# Patient Record
Sex: Male | Born: 1945 | Race: White | Hispanic: No | Smoking: Former smoker
Health system: Southern US, Community
[De-identification: ages and names within clinical notes are randomized; demographics above are authoritative.]

## PROBLEM LIST (undated history)

## (undated) DIAGNOSIS — G709 Myoneural disorder, unspecified: Secondary | ICD-10-CM

## (undated) DIAGNOSIS — IMO0001 Reserved for inherently not codable concepts without codable children: Secondary | ICD-10-CM

## (undated) DIAGNOSIS — I10 Essential (primary) hypertension: Secondary | ICD-10-CM

## (undated) DIAGNOSIS — C801 Malignant (primary) neoplasm, unspecified: Secondary | ICD-10-CM

## (undated) DIAGNOSIS — F419 Anxiety disorder, unspecified: Secondary | ICD-10-CM

## (undated) DIAGNOSIS — R413 Other amnesia: Secondary | ICD-10-CM

## (undated) DIAGNOSIS — I639 Cerebral infarction, unspecified: Secondary | ICD-10-CM

## (undated) HISTORY — PX: HAND SURGERY: SHX662

## (undated) HISTORY — DX: Other amnesia: R41.3

---

## 2008-08-20 ENCOUNTER — Ambulatory Visit: Payer: Self-pay | Admitting: Diagnostic Radiology

## 2008-08-20 ENCOUNTER — Emergency Department (HOSPITAL_BASED_OUTPATIENT_CLINIC_OR_DEPARTMENT_OTHER): Admission: EM | Admit: 2008-08-20 | Discharge: 2008-08-20 | Payer: Self-pay | Admitting: Emergency Medicine

## 2009-07-28 DIAGNOSIS — I639 Cerebral infarction, unspecified: Secondary | ICD-10-CM

## 2009-07-28 HISTORY — DX: Cerebral infarction, unspecified: I63.9

## 2010-04-22 ENCOUNTER — Ambulatory Visit: Payer: Self-pay | Admitting: Cardiology

## 2010-04-22 ENCOUNTER — Inpatient Hospital Stay (HOSPITAL_COMMUNITY): Admission: EM | Admit: 2010-04-22 | Discharge: 2010-05-02 | Payer: Self-pay | Admitting: Emergency Medicine

## 2010-04-22 ENCOUNTER — Ambulatory Visit: Payer: Self-pay | Admitting: Family Medicine

## 2010-04-25 ENCOUNTER — Encounter: Payer: Self-pay | Admitting: Family Medicine

## 2010-04-26 ENCOUNTER — Ambulatory Visit: Payer: Self-pay | Admitting: Vascular Surgery

## 2010-04-26 ENCOUNTER — Encounter: Payer: Self-pay | Admitting: Family Medicine

## 2010-10-10 LAB — LIPID PANEL
HDL: 56 mg/dL (ref >39)
Total CHOL/HDL Ratio: 4.6 RATIO
VLDL: 27 mg/dL (ref 0–40)

## 2010-10-10 LAB — PTH, INTACT AND CALCIUM: Calcium, Total (PTH): 9.4 mg/dL (ref 8.4–10.5)

## 2010-10-10 LAB — HEMOGLOBIN A1C: Mean Plasma Glucose: 126 mg/dL — ABNORMAL HIGH (ref <117)

## 2010-10-10 LAB — URINALYSIS, ROUTINE W REFLEX MICROSCOPIC
Bilirubin Urine: NEGATIVE
Glucose, UA: NEGATIVE mg/dL
Hgb urine dipstick: NEGATIVE
Specific Gravity, Urine: 1.007 (ref 1.005–1.030)
Urobilinogen, UA: 1 mg/dL (ref 0.0–1.0)
pH: 7.5 (ref 5.0–8.0)

## 2010-10-10 LAB — URINE MICROSCOPIC-ADD ON

## 2010-10-10 LAB — COMPREHENSIVE METABOLIC PANEL
ALT: 29 U/L (ref 0–53)
ALT: 31 U/L (ref 0–53)
ALT: 21 U/L (ref 0–53)
AST: 32 U/L (ref 0–37)
AST: 37 U/L (ref 0–37)
Albumin: 2.5 g/dL — ABNORMAL LOW (ref 3.5–5.2)
Albumin: 2.8 g/dL — ABNORMAL LOW (ref 3.5–5.2)
Alkaline Phosphatase: 71 U/L (ref 39–117)
Alkaline Phosphatase: 76 U/L (ref 39–117)
Alkaline Phosphatase: 97 U/L (ref 39–117)
BUN: 5 mg/dL — ABNORMAL LOW (ref 6–23)
BUN: 6 mg/dL (ref 6–23)
CO2: 29 mEq/L (ref 19–32)
CO2: 29 mEq/L (ref 19–32)
CO2: 37 mEq/L — ABNORMAL HIGH (ref 19–32)
Chloride: 97 mEq/L (ref 96–112)
Creatinine, Ser: 1.27 mg/dL (ref 0.4–1.5)
Creatinine, Ser: 0.72 mg/dL (ref 0.4–1.5)
GFR calc Af Amer: >60 mL/min (ref >60)
GFR calc Af Amer: >60 mL/min (ref >60)
GFR calc non Af Amer: 57 mL/min — ABNORMAL LOW (ref >60)
GFR calc non Af Amer: >60 mL/min (ref >60)
GFR calc non Af Amer: >60 mL/min (ref >60)
Glucose, Bld: 129 mg/dL — ABNORMAL HIGH (ref 70–99)
Glucose, Bld: 104 mg/dL — ABNORMAL HIGH (ref 70–99)
Potassium: 4.3 mEq/L (ref 3.5–5.1)
Potassium: 4.6 mEq/L (ref 3.5–5.1)
Potassium: 2.3 mEq/L — CL (ref 3.5–5.1)
Sodium: 135 mEq/L (ref 135–145)
Sodium: 127 mEq/L — ABNORMAL LOW (ref 135–145)
Total Bilirubin: 0.5 mg/dL (ref 0.3–1.2)
Total Protein: 5.9 g/dL — ABNORMAL LOW (ref 6.0–8.3)
Total Protein: 5.8 g/dL — ABNORMAL LOW (ref 6.0–8.3)

## 2010-10-10 LAB — BASIC METABOLIC PANEL
BUN: 3 mg/dL — ABNORMAL LOW (ref 6–23)
BUN: 5 mg/dL — ABNORMAL LOW (ref 6–23)
CO2: 29 mEq/L (ref 19–32)
CO2: 31 mEq/L (ref 19–32)
CO2: 34 mEq/L — ABNORMAL HIGH (ref 19–32)
Calcium: 7.6 mg/dL — ABNORMAL LOW (ref 8.4–10.5)
Calcium: 8.2 mg/dL — ABNORMAL LOW (ref 8.4–10.5)
Calcium: 8.6 mg/dL (ref 8.4–10.5)
Chloride: 91 mEq/L — ABNORMAL LOW (ref 96–112)
Chloride: 92 mEq/L — ABNORMAL LOW (ref 96–112)
Creatinine, Ser: 0.66 mg/dL (ref 0.4–1.5)
Creatinine, Ser: 0.75 mg/dL (ref 0.4–1.5)
Creatinine, Ser: 0.75 mg/dL (ref 0.4–1.5)
GFR calc Af Amer: >60 mL/min (ref >60)
GFR calc Af Amer: >60 mL/min (ref >60)
GFR calc Af Amer: >60 mL/min (ref >60)
GFR calc Af Amer: >60 mL/min (ref >60)
GFR calc non Af Amer: >60 mL/min (ref >60)
GFR calc non Af Amer: >60 mL/min (ref >60)
GFR calc non Af Amer: >60 mL/min (ref >60)
GFR calc non Af Amer: >60 mL/min (ref >60)
Glucose, Bld: 104 mg/dL — ABNORMAL HIGH (ref 70–99)
Glucose, Bld: 117 mg/dL — ABNORMAL HIGH (ref 70–99)
Glucose, Bld: 141 mg/dL — ABNORMAL HIGH (ref 70–99)
Potassium: 4.3 mEq/L (ref 3.5–5.1)
Potassium: 4.6 mEq/L (ref 3.5–5.1)
Potassium: 2.4 mEq/L — CL (ref 3.5–5.1)
Sodium: 128 mEq/L — ABNORMAL LOW (ref 135–145)
Sodium: 132 mEq/L — ABNORMAL LOW (ref 135–145)
Sodium: 131 mEq/L — ABNORMAL LOW (ref 135–145)
Sodium: 132 mEq/L — ABNORMAL LOW (ref 135–145)

## 2010-10-10 LAB — CBC
HCT: 38.7 % — ABNORMAL LOW (ref 39.0–52.0)
HCT: 39.5 % (ref 39.0–52.0)
HCT: 44.1 % (ref 39.0–52.0)
Hemoglobin: 13.6 g/dL (ref 13.0–17.0)
Hemoglobin: 14.6 g/dL (ref 13.0–17.0)
Hemoglobin: 16.1 g/dL (ref 13.0–17.0)
MCH: 31.6 pg (ref 26.0–34.0)
MCH: 32.4 pg (ref 26.0–34.0)
MCHC: 37.2 g/dL — ABNORMAL HIGH (ref 30.0–36.0)
MCHC: 37.5 g/dL — ABNORMAL HIGH (ref 30.0–36.0)
MCV: 86.4 fL (ref 78.0–100.0)
MCV: 88.1 fL (ref 78.0–100.0)
Platelets: 266 10*3/uL (ref 150–400)
Platelets: 298 10*3/uL (ref 150–400)
Platelets: 302 10*3/uL (ref 150–400)
RBC: 4.62 MIL/uL (ref 4.22–5.81)
RBC: 5.07 MIL/uL (ref 4.22–5.81)
RDW: 13 % (ref 11.5–15.5)
RDW: 13.3 % (ref 11.5–15.5)
RDW: 13.3 % (ref 11.5–15.5)
RDW: 13.4 % (ref 11.5–15.5)
WBC: 10.3 10*3/uL (ref 4.0–10.5)
WBC: 7.6 10*3/uL (ref 4.0–10.5)
WBC: 8.7 10*3/uL (ref 4.0–10.5)
WBC: 8.8 10*3/uL (ref 4.0–10.5)
WBC: 9.6 10*3/uL (ref 4.0–10.5)

## 2010-10-10 LAB — GLUCOSE, CAPILLARY: Glucose-Capillary: 122 mg/dL — ABNORMAL HIGH (ref 70–99)

## 2010-10-10 LAB — CARDIAC PANEL(CRET KIN+CKTOT+MB+TROPI)
CK, MB: 2.1 ng/mL (ref 0.3–4.0)
CK, MB: 2.2 ng/mL (ref 0.3–4.0)
Relative Index: 2 (ref 0.0–2.5)
Relative Index: INVALID (ref 0.0–2.5)
Total CK: 109 U/L (ref 7–232)
Troponin I: 0.04 ng/mL (ref 0.00–0.06)

## 2010-10-10 LAB — BASIC METABOLIC PANEL WITH GFR
BUN: 24 mg/dL — ABNORMAL HIGH (ref 6–23)
Calcium: 9.1 mg/dL (ref 8.4–10.5)
Creatinine, Ser: 1.21 mg/dL (ref 0.4–1.5)
GFR calc non Af Amer: >60 mL/min (ref >60)
Glucose, Bld: 89 mg/dL (ref 70–99)

## 2010-10-10 LAB — DIFFERENTIAL
Blasts: 0 %
Lymphocytes Relative: 19 % (ref 12–46)
Lymphs Abs: 1.7 10*3/uL (ref 0.7–4.0)
Myelocytes: 0 %
Neutro Abs: 6.1 10*3/uL (ref 1.7–7.7)
Neutrophils Relative %: 70 % (ref 43–77)
Promyelocytes Absolute: 0 %
nRBC: 0 /100 WBC

## 2010-10-10 LAB — AMMONIA: Ammonia: 16 umol/L (ref 11–35)

## 2010-10-10 LAB — MAGNESIUM
Magnesium: 2.3 mg/dL (ref 1.5–2.5)
Magnesium: 2.3 mg/dL (ref 1.5–2.5)
Magnesium: 2.4 mg/dL (ref 1.5–2.5)

## 2010-10-10 LAB — PREALBUMIN: Prealbumin: 25.4 mg/dL (ref 18.0–45.0)

## 2010-10-10 LAB — ETHANOL: Alcohol, Ethyl (B): <5 mg/dL (ref 0–10)

## 2010-10-10 LAB — POTASSIUM
Potassium: 3.5 mEq/L (ref 3.5–5.1)
Potassium: 3.5 mEq/L (ref 3.5–5.1)

## 2010-10-10 LAB — PHOSPHORUS: Phosphorus: 6.2 mg/dL — ABNORMAL HIGH (ref 2.3–4.6)

## 2010-10-10 LAB — PROTIME-INR: INR: 0.89 (ref 0.00–1.49)

## 2010-10-10 LAB — PSA: PSA: 0.8 ng/mL (ref 0.10–4.00)

## 2010-10-10 LAB — HEMOCCULT GUIAC POC 1CARD (OFFICE): Fecal Occult Bld: NEGATIVE

## 2010-11-11 LAB — CBC
HCT: 38.4 % — ABNORMAL LOW (ref 39.0–52.0)
Hemoglobin: 13.5 g/dL (ref 13.0–17.0)
RBC: 4.15 MIL/uL — ABNORMAL LOW (ref 4.22–5.81)
RDW: 12.1 % (ref 11.5–15.5)

## 2010-11-11 LAB — BASIC METABOLIC PANEL
BUN: 5 mg/dL — ABNORMAL LOW (ref 6–23)
CO2: 28 mEq/L (ref 19–32)
Calcium: 8.1 mg/dL — ABNORMAL LOW (ref 8.4–10.5)
Chloride: 87 mEq/L — ABNORMAL LOW (ref 96–112)
Creatinine, Ser: 0.7 mg/dL (ref 0.4–1.5)
GFR calc Af Amer: >60 mL/min (ref >60)
GFR calc Af Amer: >60 mL/min (ref >60)
Potassium: 3.8 mEq/L (ref 3.5–5.1)

## 2010-11-11 LAB — POCT CARDIAC MARKERS: Troponin i, poc: <0.05 ng/mL (ref 0.00–0.09)

## 2010-11-11 LAB — DIFFERENTIAL
Eosinophils Relative: 2 % (ref 0–5)
Lymphocytes Relative: 13 % (ref 12–46)
Lymphs Abs: 1 10*3/uL (ref 0.7–4.0)
Monocytes Absolute: 0.6 10*3/uL (ref 0.1–1.0)

## 2014-08-16 ENCOUNTER — Emergency Department (HOSPITAL_COMMUNITY)
Admission: EM | Admit: 2014-08-16 | Discharge: 2014-08-16 | Disposition: A | Discharge status: Home / Self Care | Payer: Medicare Other | Attending: Emergency Medicine | Admitting: Emergency Medicine

## 2014-08-16 ENCOUNTER — Emergency Department (HOSPITAL_COMMUNITY): Payer: Medicare Other

## 2014-08-16 ENCOUNTER — Encounter (HOSPITAL_COMMUNITY): Payer: Self-pay

## 2014-08-16 DIAGNOSIS — R55 Syncope and collapse: Secondary | ICD-10-CM | POA: Diagnosis not present

## 2014-08-16 DIAGNOSIS — Z7982 Long term (current) use of aspirin: Secondary | ICD-10-CM | POA: Insufficient documentation

## 2014-08-16 DIAGNOSIS — I10 Essential (primary) hypertension: Secondary | ICD-10-CM | POA: Diagnosis not present

## 2014-08-16 DIAGNOSIS — Z87891 Personal history of nicotine dependence: Secondary | ICD-10-CM | POA: Diagnosis not present

## 2014-08-16 DIAGNOSIS — Z8673 Personal history of transient ischemic attack (TIA), and cerebral infarction without residual deficits: Secondary | ICD-10-CM | POA: Diagnosis not present

## 2014-08-16 DIAGNOSIS — E871 Hypo-osmolality and hyponatremia: Secondary | ICD-10-CM | POA: Diagnosis not present

## 2014-08-16 DIAGNOSIS — Z79899 Other long term (current) drug therapy: Secondary | ICD-10-CM | POA: Insufficient documentation

## 2014-08-16 DIAGNOSIS — R4182 Altered mental status, unspecified: Secondary | ICD-10-CM

## 2014-08-16 HISTORY — DX: Cerebral infarction, unspecified: I63.9

## 2014-08-16 HISTORY — DX: Essential (primary) hypertension: I10

## 2014-08-16 LAB — I-STAT CHEM 8, ED
BUN: 9 mg/dL (ref 6–23)
CALCIUM ION: 1.06 mmol/L — AB (ref 1.13–1.30)
Chloride: 93 mEq/L — ABNORMAL LOW (ref 96–112)
Creatinine, Ser: 1.2 mg/dL (ref 0.50–1.35)
Glucose, Bld: 114 mg/dL — ABNORMAL HIGH (ref 70–99)
HCT: 45 % (ref 39.0–52.0)
HEMOGLOBIN: 15.3 g/dL (ref 13.0–17.0)
Potassium: 4.1 mmol/L (ref 3.5–5.1)
SODIUM: 127 mmol/L — AB (ref 135–145)
TCO2: 19 mmol/L (ref 0–100)

## 2014-08-16 LAB — CBC
HCT: 37.9 % — ABNORMAL LOW (ref 39.0–52.0)
Hemoglobin: 13.7 g/dL (ref 13.0–17.0)
MCH: 30.8 pg (ref 26.0–34.0)
MCHC: 36.1 g/dL — ABNORMAL HIGH (ref 30.0–36.0)
MCV: 85.2 fL (ref 78.0–100.0)
Platelets: 300 10*3/uL (ref 150–400)
RBC: 4.45 MIL/uL (ref 4.22–5.81)
RDW: 12.7 % (ref 11.5–15.5)
WBC: 11.5 10*3/uL — ABNORMAL HIGH (ref 4.0–10.5)

## 2014-08-16 LAB — COMPREHENSIVE METABOLIC PANEL
ALT: 14 U/L (ref 0–53)
AST: 23 U/L (ref 0–37)
Albumin: 3.9 g/dL (ref 3.5–5.2)
Alkaline Phosphatase: 64 U/L (ref 39–117)
Anion gap: 11 (ref 5–15)
BUN: 8 mg/dL (ref 6–23)
CALCIUM: 9.2 mg/dL (ref 8.4–10.5)
CO2: 23 mmol/L (ref 19–32)
Chloride: 93 mEq/L — ABNORMAL LOW (ref 96–112)
Creatinine, Ser: 1.27 mg/dL (ref 0.50–1.35)
GFR calc non Af Amer: 56 mL/min — ABNORMAL LOW (ref >90)
GFR, EST AFRICAN AMERICAN: 65 mL/min — AB (ref >90)
GLUCOSE: 115 mg/dL — AB (ref 70–99)
Potassium: 4.1 mmol/L (ref 3.5–5.1)
SODIUM: 127 mmol/L — AB (ref 135–145)
TOTAL PROTEIN: 6.7 g/dL (ref 6.0–8.3)
Total Bilirubin: 0.6 mg/dL (ref 0.3–1.2)

## 2014-08-16 LAB — DIFFERENTIAL
BASOS ABS: 0 10*3/uL (ref 0.0–0.1)
Basophils Relative: 0 % (ref 0–1)
EOS ABS: 0.3 10*3/uL (ref 0.0–0.7)
Eosinophils Relative: 2 % (ref 0–5)
Lymphocytes Relative: 7 % — ABNORMAL LOW (ref 12–46)
Lymphs Abs: 0.8 10*3/uL (ref 0.7–4.0)
Monocytes Absolute: 0.8 10*3/uL (ref 0.1–1.0)
Monocytes Relative: 7 % (ref 3–12)
Neutro Abs: 9.6 10*3/uL — ABNORMAL HIGH (ref 1.7–7.7)
Neutrophils Relative %: 84 % — ABNORMAL HIGH (ref 43–77)

## 2014-08-16 LAB — APTT: aPTT: 27 seconds (ref 24–37)

## 2014-08-16 LAB — ETHANOL: Alcohol, Ethyl (B): <5 mg/dL (ref 0–9)

## 2014-08-16 LAB — I-STAT TROPONIN, ED: Troponin i, poc: 0 ng/mL (ref 0.00–0.08)

## 2014-08-16 LAB — PROTIME-INR
INR: 1.06 (ref 0.00–1.49)
PROTHROMBIN TIME: 13.9 s (ref 11.6–15.2)

## 2014-08-16 MED ORDER — BACLOFEN 10 MG PO TABS: 10.0000 mg | ORAL_TABLET | Freq: Three times a day (TID) | ORAL | Status: DC

## 2014-08-16 NOTE — ED Notes (Signed)
[facility]- pt having lunch with daughter and had a spell of confusion and unresponsiveness. Symptoms resolved by EMS arrival. A&O x4 at this time. Pt has hx of stroke with right side paralysis, also has hx of TIA. Pt at baseline on arrival.

## 2014-08-16 NOTE — Discharge Instructions (Signed)
You MUST see the doctors in the outpatient setting. - see the above phone numbers  REturn to the ER if you develop recurrent, prolonged or worsening symptoms.  Please call your doctor for a followup appointment within 24-48 hours. When you talk to your doctor please let them know that you were seen in the emergency department and have them acquire all of your records so that they can discuss the findings with you and formulate a treatment plan to fully care for your new and ongoing problems.

## 2014-08-16 NOTE — ED Provider Notes (Signed)
CSN: [id number]     Arrival date & time 08/16/14  1314 History   First MD Initiated Contact with Patient 08/16/14 1321     Chief Complaint  Patient presents with  . Transient Ischemic Attack     (Consider location/radiation/quality/duration/timing/severity/associated sxs/prior Treatment) HPI Comments: The patient is a 69 year old male with a history of stroke affecting his right side including his right upper and lower extremities. His family members report that they were at lunch with him eating a biscuit when he had acute onset of altered mental status, he was very confused and then went unresponsive with his head drooped, drooling out of one side of his mouth, there was no seizure activity, no vomiting, this lasted several minutes and then resolve spontaneously. By the time the paramedics arrived the patient had no symptoms. He does have a history of stroke with right-sided paralysis, he has also had TIAs in the past, they usually present like this. He does take a baby aspirin as anticoagulation. At this time the patient and his family members feel like he is at his baseline.  The history is provided by the patient.    Past Medical History  Diagnosis Date  . Stroke   . Hypertension    Past Surgical History  Procedure Laterality Date  . Hand surgery     History reviewed. No pertinent family history. History  Substance Use Topics  . Smoking status: Former Smoker    Quit date: 04/26/2010  . Smokeless tobacco: Not on file  . Alcohol Use: 12.0 oz/week    20 Cans of beer per week     Comment: Pt drinks daily    Review of Systems  All other systems reviewed and are negative.     Allergies  Review of patient's allergies indicates no known allergies.  Home Medications   Prior to Admission medications   Medication Sig Start Date End Date Taking? Authorizing Provider  amLODipine (NORVASC) 5 MG tablet Take 5 mg by mouth every morning. 06/30/14  Yes Historical Provider, MD   aspirin EC 81 MG tablet Take 81 mg by mouth daily.   Yes Historical Provider, MD  Cholecalciferol (VITAMIN D3 PO) Take 1 tablet by mouth daily.   Yes Historical Provider, MD  FOLIC ACID PO Take 1 tablet by mouth daily.   Yes Historical Provider, MD  lisinopril (PRINIVIL,ZESTRIL) 40 MG tablet Take 40 mg by mouth every morning. 06/30/14  Yes Historical Provider, MD  Thiamine HCl (VITAMIN B-1 PO) Take 1 tablet by mouth daily.   Yes Historical Provider, MD   BP 149/78 mmHg  Pulse 92  Temp(Src) 97.8 F (36.6 C) (Oral)  Resp 12  SpO2 99% Physical Exam  Constitutional: He appears well-developed and well-nourished. No distress.  HENT:  Head: Normocephalic and atraumatic.  Mouth/Throat: Oropharynx is clear and moist. No oropharyngeal exudate.  Eyes: Conjunctivae and EOM are normal. Pupils are equal, round, and reactive to light. Right eye exhibits no discharge. Left eye exhibits no discharge. No scleral icterus.  Neck: Normal range of motion. Neck supple. No JVD present. No thyromegaly present.  Cardiovascular: Normal rate, regular rhythm, normal heart sounds and intact distal pulses.  Exam reveals no gallop and no friction rub.   No murmur heard. Pulmonary/Chest: Effort normal and breath sounds normal. No respiratory distress. He has no wheezes. He has no rales.  Abdominal: Soft. Bowel sounds are normal. He exhibits no distension and no mass. There is no tenderness.  Musculoskeletal: Normal range of motion. He exhibits  no edema or tenderness.  Lymphadenopathy:    He has no cervical adenopathy.  Neurological: He is alert. Coordination normal.  Neurologic exam is baseline, the patient has flexion contractures of the right upper extremity, he is able to lift the right lower extremity off the bed though not as strong as the left side. He has no facial droop, cranial nerves III through XII are intact, no slurred speech.  Skin: Skin is warm and dry. No rash noted. No erythema.  Psychiatric: He has a  normal mood and affect. His behavior is normal.  Nursing note and vitals reviewed.   ED Course  Procedures (including critical care time) Labs Review Labs Reviewed  CBC - Abnormal; Notable for the following:    WBC 11.5 (*)    HCT 37.9 (*)    MCHC 36.1 (*)    All other components within normal limits  DIFFERENTIAL - Abnormal; Notable for the following:    Neutrophils Relative % 84 (*)    Neutro Abs 9.6 (*)    Lymphocytes Relative 7 (*)    All other components within normal limits  COMPREHENSIVE METABOLIC PANEL - Abnormal; Notable for the following:    Sodium 127 (*)    Chloride 93 (*)    Glucose, Bld 115 (*)    GFR calc non Af Amer 56 (*)    GFR calc Af Amer 65 (*)    All other components within normal limits  I-STAT CHEM 8, ED - Abnormal; Notable for the following:    Sodium 127 (*)    Chloride 93 (*)    Glucose, Bld 114 (*)    Calcium, Ion 1.06 (*)    All other components within normal limits  PROTIME-INR  APTT  ETHANOL  I-STAT TROPOININ, ED  I-STAT TROPOININ, ED    Imaging Review Ct Head Wo Contrast  08/16/2014   CLINICAL DATA:  Recent syncopal episode  EXAM: CT HEAD WITHOUT CONTRAST  TECHNIQUE: Contiguous axial images were obtained from the base of the skull through the vertex without intravenous contrast.  COMPARISON:  04/24/2010  FINDINGS: Bony calvarium is intact. Diffuse atrophic changes and chronic white matter ischemic change is noted. Changes are similar to that seen on the prior exam. No findings to suggest acute hemorrhage, acute infarction or space-occupying mass lesion are noted. Bilateral thalamic lacunar infarcts are seen, new from the prior exam.  IMPRESSION: Chronic atrophic in ischemic change.  No acute abnormality is noted.   Electronically Signed   By: Inez Catalina M.D.   On: 08/16/2014 15:13      MDM   Final diagnoses:  Altered mental status, unspecified altered mental status type  Hyponatremia    The patient is well-appearing at this time,  he appears at baseline, family agrees, he did have some kind of neurologic event which could be consistent with a stroke, would also consider seizure, syncope, observation in the emergency department with labs, readdress. I have suggested that the patient be admitted to the hospital, he does not want to be admitted at this time.  ED ECG REPORT  I personally interpreted this EKG   Date: 08/16/2014   Rate: 78  Rhythm: normal sinus rhythm  QRS Axis: normal  Intervals: normal  ST/T Wave abnormalities: normal  Conduction Disutrbances:none  Narrative Interpretation:   Old EKG Reviewed: none available  D/w Neuro - Dr. Doy Mince agrees that pt can be f/u in outpt setting, recommends against admission for TIA w/u - the patient refuses admission anyway, he  will follow-up in the outpatient setting with neurology.    No recurrent abnormal MS - has been at baseline since arrival.  Pt aware of need to f/u and is agreeable.  Filed Vitals:   08/16/14 1321 08/16/14 1325 08/16/14 1433  BP:  149/78   Pulse:  92   Temp:  97.8 F (36.6 C) 97.8 F (36.6 C)  TempSrc:  Oral   Resp:  12   SpO2: 97% 99%      Johnna Acosta, MD 08/16/14 [phone]

## 2014-08-16 NOTE — ED Notes (Addendum)
Pt family has questions concerning discharge, Dr. Sabra Heck at bedside to discuss concerns.

## 2014-09-21 ENCOUNTER — Encounter: Payer: Self-pay | Admitting: *Deleted

## 2014-09-27 ENCOUNTER — Encounter: Payer: Self-pay | Admitting: Neurology

## 2014-09-27 ENCOUNTER — Ambulatory Visit (INDEPENDENT_AMBULATORY_CARE_PROVIDER_SITE_OTHER): Payer: Medicare Other | Admitting: Neurology

## 2014-09-27 VITALS — BP 130/82 | HR 80 | Temp 98.6°F | Resp 20 | Ht 69.0 in | Wt 125.6 lb

## 2014-09-27 DIAGNOSIS — I69959 Hemiplegia and hemiparesis following unspecified cerebrovascular disease affecting unspecified side: Secondary | ICD-10-CM

## 2014-09-27 DIAGNOSIS — R4182 Altered mental status, unspecified: Secondary | ICD-10-CM

## 2014-09-27 DIAGNOSIS — I69359 Hemiplegia and hemiparesis following cerebral infarction affecting unspecified side: Secondary | ICD-10-CM

## 2014-09-27 MED ORDER — BACLOFEN 10 MG PO TABS
10.0000 mg | ORAL_TABLET | Freq: Four times a day (QID) | ORAL | Status: AC
Start: 2014-09-27 — End: ?

## 2014-09-27 MED ORDER — PRAVASTATIN SODIUM 80 MG PO TABS: 80.0000 mg | ORAL_TABLET | Freq: Every day | ORAL | Status: DC

## 2014-09-27 NOTE — Progress Notes (Signed)
NEUROLOGY CONSULTATION NOTE  Hector Rice MRN: [id number] DOB: [birth year]  Referring provider: ED Primary care provider: none  Reason for consult:  TIA  HISTORY OF PRESENT ILLNESS: Hector Rice is a 69 year old right-handed man with hypertension and history of stroke and TIAs who presents via ED referral for transient ischemic attack.  Records, labs, MRI and MRA of head, and CT of head reviewed.  On 04/22/10, he had an acute left thalamo-capsular infarct, presenting with right sided weakness.  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  He was on Plavix and it was advised that he continue it.  During that admission, he exhibited brief jerking of the right arm.  MRA of head, echo and carotid dopplers were unremarkable.  EEG was performed, which was unremarkable.  He was started on Keppra 500mg  twice daily.  Since then, he was followed by a neurologist, Dr. Lacinda Axon, in [place].  Due to bruising, Plavix was switched to ASA 81mg .  He subsequently tapered himself off of the Keppra.  He takes baclofen 10mg  4 times daily for spasticity.  He also takes Provachal 80mg  and blood pressure medications.  These were all filled by Dr. Lacinda Axon as he does not have a primary care physician.  He has been out of his medications because he has not followed up with him in a year.  Due to bruising,   On 08/16/14, he was at lunch with family when he suddenly lost consciousness.  They had not begun eating yet.  He momentarily felt flushed before he passed out.  He reportedly was drooling and he was unconscious for about a couple of minutes.  He denied any preceding lightheadedness, tunnel vision or palpitations.  He was back to baseline when EMS arrived.  He was evaluated at the ED.  EKG showed sinus rhythm of 78 bpm.  CT of head showed diffuse atrophy and chronic small vessel ischemic changes including remote bilateral thalamic infarcts, which are new compared to prior study from 04/24/10.  He was found to be  hyponatremic, which level of 127.  Renal function and LFTs were unremarkable.  CBC showed mildly elevated WBC of 11.5 and low HCT of 37.9, but overall unremarkable.  He reports 2 or 3 similar previous episodes of passing out prior to the stroke.  PAST MEDICAL HISTORY: Past Medical History  Diagnosis Date  . Stroke   . Hypertension   . Memory loss     PAST SURGICAL HISTORY: Past Surgical History  Procedure Laterality Date  . Hand surgery      MEDICATIONS: Current Outpatient Prescriptions on File Prior to Visit  Medication Sig Dispense Refill  . amLODipine (NORVASC) 5 MG tablet Take 5 mg by mouth every morning.  3  . aspirin EC 81 MG tablet Take 81 mg by mouth daily.    . Cholecalciferol (VITAMIN D3 PO) Take 1 tablet by mouth daily.    Marland Kitchen FOLIC ACID PO Take 1 tablet by mouth daily.    Marland Kitchen lisinopril (PRINIVIL,ZESTRIL) 40 MG tablet Take 40 mg by mouth every morning.  3  . Thiamine HCl (VITAMIN B-1 PO) Take 1 tablet by mouth daily.     No current facility-administered medications on file prior to visit.    ALLERGIES: No Known Allergies  FAMILY HISTORY: Family History  Problem Relation Age of Onset  . Alzheimer's disease Mother 55  . Heart failure Father 1  . Immunocompromised Sister   . COPD Sister   . Migraines Daughter   .  Immunocompromised Daughter   . Stroke Maternal Grandmother     SOCIAL HISTORY: History   Social History  . Marital Status: Single    Spouse Name: N/A  . Number of Children: N/A  . Years of Education: N/A   Occupational History  . Not on file.   Social History Main Topics  . Smoking status: Former Smoker    Quit date: 04/26/2010  . Smokeless tobacco: Never Used  . Alcohol Use: 12.0 oz/week    20 Cans of beer per week     Comment: Pt drinks daily  . Drug Use: Not on file  . Sexual Activity:    Partners: Female   Other Topics Concern  . Not on file   Social History Narrative    REVIEW OF SYSTEMS: Constitutional: No fevers,  chills, or sweats, no generalized fatigue, change in appetite Eyes: No visual changes, double vision, eye pain Ear, nose and throat: No hearing loss, ear pain, nasal congestion, sore throat Cardiovascular: No chest pain, palpitations Respiratory:  No shortness of breath at rest or with exertion, wheezes GastrointestinaI: No nausea, vomiting, diarrhea, abdominal pain, fecal incontinence Genitourinary:  No dysuria, urinary retention or frequency Musculoskeletal:  No neck pain, back pain Integumentary: No rash, pruritus, skin lesions Neurological: as above Psychiatric: No depression, insomnia, anxiety Endocrine: No palpitations, fatigue, diaphoresis, mood swings, change in appetite, change in weight, increased thirst Hematologic/Lymphatic:  No anemia, purpura, petechiae. Allergic/Immunologic: no itchy/runny eyes, nasal congestion, recent allergic reactions, rashes  PHYSICAL EXAM: Filed Vitals:   09/27/14 0956  BP: 130/82  Pulse: 80  Temp: 98.6 F (37 C)  Resp: 20   General: No acute distress Head:  Normocephalic/atraumatic Eyes:  fundi unremarkable, without vessel changes, exudates, hemorrhages or papilledema. Neck: supple, no paraspinal tenderness, full range of motion Back: No paraspinal tenderness Heart: regular rate and rhythm Lungs: Clear to auscultation bilaterally. Vascular: No carotid bruits. Neurological Exam: Mental status: alert and oriented to person, place, and time, recent and remote memory intact, fund of knowledge intact, attention and concentration intact, speech fluent and not dysarthric, language intact. Cranial nerves: CN I: not tested CN II: pupils equal, round and reactive to light, visual fields intact, fundi unremarkable, without vessel changes, exudates, hemorrhages or papilledema. CN III, IV, VI:  full range of motion, no nystagmus, no ptosis CN V: facial sensation intact CN VII: upper and lower face symmetric CN VIII: hearing intact CN IX, X: gag  intact, uvula midline CN XI: sternocleidomastoid and trapezius muscles intact CN XII: tongue midline Bulk & Tone: spastic right upper greater than lower extremities. Motor:  Right upper extremity paretic.  Right ankle dorsiflexion 3/5.  Otherwise 5/5. Sensation:  Reduced pinprick and vibration sensation in right lower extremity Deep Tendon Reflexes:  3+ right upper and lower extremities, 2+ on left.  Toes withdraw bilaterally Finger to nose testing:  No dysmetria with left arm. Gait:  Unable to bear weight on right leg and walk unassisted.  IMPRESSION: Transient loss of consciousness.  I don't suspect this was a TIA, as symptoms are nonfocal and he has had similar episodes in the past.  Seizure or cardiac etiology should be considered History of stroke with residual spastic right hemiparesis  PLAN: 1.  Continue ASA 81mg  daily 2.  Will refill baclofen 10mg  four times daily 3.  Will refill Provachal 80mg  daily 4.  Will get EEG.  May consider restart Keppra 500mg  twice daily 5.  Will set him up to establish care with a PCP to  manage his blood pressure and cholesterol medication 6.  Follow up in 3 months.  Thank you for allowing me to take part in the care of this patient.  Metta Clines, DO

## 2014-09-27 NOTE — Patient Instructions (Addendum)
1.  I refilled the baclofen 4mg  4 times daily 2.  Continue the aspirin 81mg  daily 3.  We will get an EEG.  I may consider to start you on a seizure medication again (Keppra 500mg  twice daily) 4.  You need to establish care with a primary care physician for refills of other medications 5.  Follow up in 3 months. 6. Your Rx refill for Pravachol has been sent to your pharmacy   Please call [facility] to establish care [phone].

## 2014-10-02 ENCOUNTER — Ambulatory Visit (INDEPENDENT_AMBULATORY_CARE_PROVIDER_SITE_OTHER): Payer: Medicare Other | Admitting: Neurology

## 2014-10-02 DIAGNOSIS — R404 Transient alteration of awareness: Secondary | ICD-10-CM

## 2014-10-04 ENCOUNTER — Telehealth: Payer: Self-pay | Admitting: *Deleted

## 2014-10-04 NOTE — Procedures (Signed)
ELECTROENCEPHALOGRAM REPORT  Date of Study: 10/02/2014  Patient's Name: Hector Rice MRN: [id number] Date of Birth: 1946-01-18  Indication: 69 year old man with history of stroke who presents for recurrent brief episodes of loss of consciousness  Medications: ASA Amlodipine Lisinopril  Technical Summary: This is a multichannel digital EEG recording, using the international 10-20 placement system.  Spike detection software was employed.  Description: The EEG background is symmetric, with a well-developed posterior dominant rhythm of 8-9 Hz, which is reactive to eye opening and closing.  Diffuse beta activity is seen, with a bilateral frontal preponderance.  No focal or generalized abnormalities are seen.  No focal or generalized epileptiform discharges are seen.  Stage II sleep is not seen.  Photic stimulation was performed, and produced no abnormalities.  Hyperventilation was not performed.  ECG revealed normal cardiac rate and rhythm.  Impression: This is a normal routine EEG of the awake state, with activating procedures.  A normal study does not rule out the possibility of a seizure disorder in this patient.  [name] R. Tomi Likens, DO

## 2014-10-04 NOTE — Telephone Encounter (Signed)
-----   Message from Pieter Partridge, DO sent at 10/04/2014  6:24 AM EST ----- eeg normal ----- Message -----    From: Pieter Partridge, DO    Sent: 10/04/2014   6:23 AM      To: Pieter Partridge, DO

## 2014-10-04 NOTE — Telephone Encounter (Signed)
Patient is aware of EEG report

## 2014-11-02 ENCOUNTER — Ambulatory Visit: Payer: Medicare Other | Admitting: Family

## 2014-11-14 ENCOUNTER — Ambulatory Visit (INDEPENDENT_AMBULATORY_CARE_PROVIDER_SITE_OTHER): Payer: Medicare Other | Admitting: Family

## 2014-11-14 ENCOUNTER — Encounter: Payer: Self-pay | Admitting: Family

## 2014-11-14 VITALS — BP 150/74 | HR 93 | Temp 97.5°F | Resp 18 | Ht 69.0 in | Wt 125.0 lb

## 2014-11-14 DIAGNOSIS — I10 Essential (primary) hypertension: Secondary | ICD-10-CM

## 2014-11-14 DIAGNOSIS — E785 Hyperlipidemia, unspecified: Secondary | ICD-10-CM | POA: Insufficient documentation

## 2014-11-14 DIAGNOSIS — G8110 Spastic hemiplegia affecting unspecified side: Secondary | ICD-10-CM

## 2014-11-14 DIAGNOSIS — G8111 Spastic hemiplegia affecting right dominant side: Secondary | ICD-10-CM | POA: Insufficient documentation

## 2014-11-14 MED ORDER — PRAVASTATIN SODIUM 80 MG PO TABS: 80.0000 mg | ORAL_TABLET | Freq: Every day | ORAL | Status: DC

## 2014-11-14 MED ORDER — AMLODIPINE BESYLATE 10 MG PO TABS: 10.0000 mg | ORAL_TABLET | Freq: Every day | ORAL | Status: DC

## 2014-11-14 MED ORDER — LISINOPRIL 40 MG PO TABS: 40.0000 mg | ORAL_TABLET | Freq: Every morning | ORAL | Status: DC

## 2014-11-14 NOTE — Assessment & Plan Note (Signed)
Stable with current regimen. Denies myalgia  Patient in need of a lipid profile. We'll obtain during wellness exam. Continue atorvastatin at current dosage.

## 2014-11-14 NOTE — Assessment & Plan Note (Signed)
Blood pressures remain slightly elevated. Increase amlodipine to 10 mg. Continue lisinopril at 40 mg daily. Medication refills sent to pharmacy. Follow up in 1 month or sooner if needed.

## 2014-11-14 NOTE — Patient Instructions (Addendum)
Thank you for choosing [facility].  Summary/Instructions:  Please continue to take your medications as prescribed.  Please schedule a time for your wellness exam and blood work.   Your prescription(s) have been submitted to your pharmacy or been printed and provided for you. Please take as directed and contact our office if you believe you are having problem(s) with the medication(s) or have any questions.  If your symptoms worsen or fail to improve, please contact our office for further instruction, or in case of emergency go directly to the emergency room at the closest medical facility.

## 2014-11-14 NOTE — Progress Notes (Signed)
   Subjective:    Patient ID: Hector Rice, male    DOB: [birth year], 69 y.o.   MRN: [id number]   Chief Complaint  Patient presents with  . Establish Care    Hypertension, hyperlipidemia   HPI:  Hector Rice is a 69 y.o. male who presents today to establish care and discuss medical conditions.  1) Hypertension  - Currently maintained on amlodipine and lisinopril. Indicates that his blood pressure at home runs around 145/70.   BP Readings from Last 3 Encounters:  11/14/14 150/74  09/27/14 130/82  08/16/14 147/81    2) Hyperlipidemia - Stable with current regimen. Denies any myalgia.   3) Muscle spasm - Associated symptom of muscle spasm located in his right upper extremity which is currently being treated with baclofen which helps out tremendously. This is the result of a previous hemmorhagic stroke that resulted in spastic hemiparesis. He is currently being followed by Neurology.   Review of Systems  Eyes:       Negative for changes in vision  Respiratory: Negative for chest tightness and shortness of breath.   Cardiovascular: Negative for chest pain, palpitations and leg swelling.      Objective:    BP 150/74 mmHg  Pulse 93  Temp(Src) 97.5 F (36.4 C) (Oral)  Resp 18  Ht '5\' 9"'$  (1.753 m)  Wt 125 lb (56.7 kg)  BMI 18.45 kg/m2  SpO2 97% Nursing note and vital signs reviewed.  Physical Exam  Constitutional: He is oriented to person, place, and time. He appears well-developed and well-nourished. No distress.  Thin gentlemen seated in a wheelchair with right side upper extremity appearing contracted; dressed appropriately and appears his stated age.   Cardiovascular: Normal rate, regular rhythm, normal heart sounds and intact distal pulses.   Pulmonary/Chest: Effort normal and breath sounds normal.  Neurological: He is alert and oriented to person, place, and time.  Skin: Skin is warm and dry.  Psychiatric: He has a normal mood and affect. His behavior is normal. Judgment  and thought content normal.       Assessment & Plan:

## 2014-11-14 NOTE — Assessment & Plan Note (Signed)
Stable and maintained with baclofen.  Continue current dosage of baclofen. Currently managed by Neurology.

## 2014-11-14 NOTE — Progress Notes (Signed)
Pre visit review using our clinic review tool, if applicable. No additional management support is needed unless otherwise documented below in the visit note. 

## 2014-12-17 ENCOUNTER — Inpatient Hospital Stay (HOSPITAL_COMMUNITY): Payer: Medicare Other

## 2014-12-17 ENCOUNTER — Encounter (HOSPITAL_COMMUNITY): Payer: Self-pay | Admitting: Emergency Medicine

## 2014-12-17 ENCOUNTER — Emergency Department (HOSPITAL_COMMUNITY): Payer: Medicare Other

## 2014-12-17 ENCOUNTER — Inpatient Hospital Stay (HOSPITAL_COMMUNITY)
Admission: EM | Admit: 2014-12-17 | Discharge: 2014-12-21 | DRG: 640 | Disposition: A | Discharge status: Home / Self Care | Payer: Medicare Other | Attending: Internal Medicine | Admitting: Internal Medicine

## 2014-12-17 DIAGNOSIS — E43 Unspecified severe protein-calorie malnutrition: Secondary | ICD-10-CM | POA: Insufficient documentation

## 2014-12-17 DIAGNOSIS — I714 Abdominal aortic aneurysm, without rupture, unspecified: Secondary | ICD-10-CM

## 2014-12-17 DIAGNOSIS — E871 Hypo-osmolality and hyponatremia: Secondary | ICD-10-CM | POA: Diagnosis present

## 2014-12-17 DIAGNOSIS — I739 Peripheral vascular disease, unspecified: Secondary | ICD-10-CM | POA: Diagnosis present

## 2014-12-17 DIAGNOSIS — Z7982 Long term (current) use of aspirin: Secondary | ICD-10-CM | POA: Diagnosis not present

## 2014-12-17 DIAGNOSIS — Z7902 Long term (current) use of antithrombotics/antiplatelets: Secondary | ICD-10-CM | POA: Diagnosis not present

## 2014-12-17 DIAGNOSIS — F0390 Unspecified dementia without behavioral disturbance: Secondary | ICD-10-CM | POA: Diagnosis present

## 2014-12-17 DIAGNOSIS — I701 Atherosclerosis of renal artery: Secondary | ICD-10-CM | POA: Diagnosis present

## 2014-12-17 DIAGNOSIS — I10 Essential (primary) hypertension: Secondary | ICD-10-CM | POA: Diagnosis present

## 2014-12-17 DIAGNOSIS — G8110 Spastic hemiplegia affecting unspecified side: Secondary | ICD-10-CM | POA: Diagnosis not present

## 2014-12-17 DIAGNOSIS — E860 Dehydration: Secondary | ICD-10-CM | POA: Diagnosis present

## 2014-12-17 DIAGNOSIS — I6309 Cerebral infarction due to thrombosis of other precerebral artery: Secondary | ICD-10-CM | POA: Diagnosis not present

## 2014-12-17 DIAGNOSIS — I63312 Cerebral infarction due to thrombosis of left middle cerebral artery: Secondary | ICD-10-CM | POA: Diagnosis not present

## 2014-12-17 DIAGNOSIS — I639 Cerebral infarction, unspecified: Secondary | ICD-10-CM | POA: Diagnosis present

## 2014-12-17 DIAGNOSIS — Z993 Dependence on wheelchair: Secondary | ICD-10-CM | POA: Diagnosis not present

## 2014-12-17 DIAGNOSIS — R52 Pain, unspecified: Secondary | ICD-10-CM

## 2014-12-17 DIAGNOSIS — R160 Hepatomegaly, not elsewhere classified: Secondary | ICD-10-CM

## 2014-12-17 DIAGNOSIS — R55 Syncope and collapse: Secondary | ICD-10-CM | POA: Diagnosis present

## 2014-12-17 DIAGNOSIS — F1010 Alcohol abuse, uncomplicated: Secondary | ICD-10-CM | POA: Diagnosis present

## 2014-12-17 DIAGNOSIS — T796XXS Traumatic ischemia of muscle, sequela: Secondary | ICD-10-CM | POA: Diagnosis not present

## 2014-12-17 DIAGNOSIS — Z681 Body mass index (BMI) 19 or less, adult: Secondary | ICD-10-CM

## 2014-12-17 DIAGNOSIS — K8689 Other specified diseases of pancreas: Secondary | ICD-10-CM

## 2014-12-17 DIAGNOSIS — Z87891 Personal history of nicotine dependence: Secondary | ICD-10-CM | POA: Diagnosis not present

## 2014-12-17 DIAGNOSIS — G8111 Spastic hemiplegia affecting right dominant side: Secondary | ICD-10-CM | POA: Diagnosis present

## 2014-12-17 DIAGNOSIS — Z66 Do not resuscitate: Secondary | ICD-10-CM | POA: Diagnosis present

## 2014-12-17 DIAGNOSIS — R740 Nonspecific elevation of levels of transaminase and lactic acid dehydrogenase [LDH]: Secondary | ICD-10-CM | POA: Diagnosis not present

## 2014-12-17 DIAGNOSIS — E785 Hyperlipidemia, unspecified: Secondary | ICD-10-CM | POA: Insufficient documentation

## 2014-12-17 DIAGNOSIS — I6320 Cerebral infarction due to unspecified occlusion or stenosis of unspecified precerebral arteries: Secondary | ICD-10-CM | POA: Diagnosis not present

## 2014-12-17 DIAGNOSIS — R197 Diarrhea, unspecified: Secondary | ICD-10-CM | POA: Diagnosis present

## 2014-12-17 DIAGNOSIS — K869 Disease of pancreas, unspecified: Secondary | ICD-10-CM | POA: Diagnosis present

## 2014-12-17 DIAGNOSIS — W19XXXA Unspecified fall, initial encounter: Secondary | ICD-10-CM | POA: Diagnosis not present

## 2014-12-17 DIAGNOSIS — M6282 Rhabdomyolysis: Secondary | ICD-10-CM | POA: Diagnosis present

## 2014-12-17 DIAGNOSIS — N261 Atrophy of kidney (terminal): Secondary | ICD-10-CM

## 2014-12-17 DIAGNOSIS — R531 Weakness: Secondary | ICD-10-CM

## 2014-12-17 LAB — CBC
HEMATOCRIT: 43 % (ref 39.0–52.0)
Hemoglobin: 15.7 g/dL (ref 13.0–17.0)
MCH: 30.6 pg (ref 26.0–34.0)
MCHC: 36.5 g/dL — ABNORMAL HIGH (ref 30.0–36.0)
MCV: 83.8 fL (ref 78.0–100.0)
Platelets: 304 10*3/uL (ref 150–400)
RBC: 5.13 MIL/uL (ref 4.22–5.81)
RDW: 11.9 % (ref 11.5–15.5)
WBC: 12.4 10*3/uL — ABNORMAL HIGH (ref 4.0–10.5)

## 2014-12-17 LAB — COMPREHENSIVE METABOLIC PANEL
ALBUMIN: 4.2 g/dL (ref 3.5–5.0)
ALK PHOS: 96 U/L (ref 38–126)
ALT: 40 U/L (ref 17–63)
ANION GAP: 14 (ref 5–15)
AST: 166 U/L — AB (ref 15–41)
BUN: 6 mg/dL (ref 6–20)
CALCIUM: 8.5 mg/dL — AB (ref 8.9–10.3)
CHLORIDE: 83 mmol/L — AB (ref 101–111)
CO2: 24 mmol/L (ref 22–32)
CREATININE: 1.01 mg/dL (ref 0.61–1.24)
GFR calc non Af Amer: >60 mL/min (ref >60)
GLUCOSE: 94 mg/dL (ref 65–99)
Potassium: 3.6 mmol/L (ref 3.5–5.1)
Sodium: 121 mmol/L — ABNORMAL LOW (ref 135–145)
TOTAL PROTEIN: 7.7 g/dL (ref 6.5–8.1)
Total Bilirubin: 1 mg/dL (ref 0.3–1.2)

## 2014-12-17 LAB — OSMOLALITY: OSMOLALITY: 261 mosm/kg — AB (ref 275–300)

## 2014-12-17 LAB — URINALYSIS, ROUTINE W REFLEX MICROSCOPIC
BILIRUBIN URINE: NEGATIVE
Glucose, UA: NEGATIVE mg/dL
Ketones, ur: 15 mg/dL — AB
LEUKOCYTES UA: NEGATIVE
Nitrite: NEGATIVE
Protein, ur: 100 mg/dL — AB
SPECIFIC GRAVITY, URINE: 1.016 (ref 1.005–1.030)
Urobilinogen, UA: 0.2 mg/dL (ref 0.0–1.0)
pH: 6 (ref 5.0–8.0)

## 2014-12-17 LAB — URINE MICROSCOPIC-ADD ON

## 2014-12-17 LAB — CK TOTAL AND CKMB (NOT AT ARMC)
CK, MB: 61.4 ng/mL — AB (ref 0.5–5.0)
Relative Index: 1.7 (ref 0.0–2.5)
Total CK: 3517 U/L — ABNORMAL HIGH (ref 49–397)

## 2014-12-17 LAB — TROPONIN I: Troponin I: <0.03 ng/mL (ref <0.031)

## 2014-12-17 LAB — CK: Total CK: 6448 U/L — ABNORMAL HIGH (ref 49–397)

## 2014-12-17 MED ORDER — ACETAMINOPHEN 325 MG PO TABS: 650.0000 mg | ORAL_TABLET | Freq: Four times a day (QID) | ORAL | Status: DC | PRN

## 2014-12-17 MED ORDER — SODIUM CHLORIDE 0.9 % IV SOLN
INTRAVENOUS | Status: DC
  Administered 2014-12-18 – 2014-12-19 (×2): via INTRAVENOUS

## 2014-12-17 MED ORDER — SODIUM CHLORIDE 0.9 % IJ SOLN
3.0000 mL | Freq: Two times a day (BID) | INTRAMUSCULAR | Status: DC
  Administered 2014-12-17 – 2014-12-21 (×4): 3 mL via INTRAVENOUS

## 2014-12-17 MED ORDER — BACLOFEN 10 MG PO TABS: 10.0000 mg | ORAL_TABLET | Freq: Every day | ORAL | Status: DC | PRN

## 2014-12-17 MED ORDER — MORPHINE SULFATE 4 MG/ML IJ SOLN
4.0000 mg | Freq: Once | INTRAMUSCULAR | Status: AC
  Administered 2014-12-17: 4 mg via INTRAVENOUS
  Filled 2014-12-17: qty 1

## 2014-12-17 MED ORDER — ALUM & MAG HYDROXIDE-SIMETH 200-200-20 MG/5ML PO SUSP: 30.0000 mL | Freq: Four times a day (QID) | ORAL | Status: DC | PRN

## 2014-12-17 MED ORDER — ASPIRIN EC 81 MG PO TBEC
81.0000 mg | DELAYED_RELEASE_TABLET | Freq: Every day | ORAL | Status: DC
  Administered 2014-12-17 – 2014-12-18 (×2): 81 mg via ORAL
  Filled 2014-12-17 (×2): qty 1

## 2014-12-17 MED ORDER — PRAVASTATIN SODIUM 40 MG PO TABS
80.0000 mg | ORAL_TABLET | Freq: Every day | ORAL | Status: DC
  Administered 2014-12-17 – 2014-12-21 (×5): 80 mg via ORAL
  Filled 2014-12-17 (×5): qty 2

## 2014-12-17 MED ORDER — ONDANSETRON HCL 4 MG/2ML IJ SOLN: 4.0000 mg | Freq: Four times a day (QID) | INTRAMUSCULAR | Status: DC | PRN

## 2014-12-17 MED ORDER — ACETAMINOPHEN 650 MG RE SUPP: 650.0000 mg | Freq: Four times a day (QID) | RECTAL | Status: DC | PRN

## 2014-12-17 MED ORDER — VITAMIN B-1 100 MG PO TABS
100.0000 mg | ORAL_TABLET | Freq: Every day | ORAL | Status: DC
  Administered 2014-12-17 – 2014-12-21 (×5): 100 mg via ORAL
  Filled 2014-12-17 (×5): qty 1

## 2014-12-17 MED ORDER — VITAMIN D3 25 MCG (1000 UNIT) PO TABS
1000.0000 [IU] | ORAL_TABLET | Freq: Every day | ORAL | Status: DC
  Administered 2014-12-17 – 2014-12-21 (×5): 1000 [IU] via ORAL
  Filled 2014-12-17 (×10): qty 1

## 2014-12-17 MED ORDER — HYDROMORPHONE HCL 1 MG/ML IJ SOLN: 1.0000 mg | INTRAMUSCULAR | Status: DC | PRN

## 2014-12-17 MED ORDER — ENOXAPARIN SODIUM 40 MG/0.4ML ~~LOC~~ SOLN
40.0000 mg | SUBCUTANEOUS | Status: DC
  Administered 2014-12-17 – 2014-12-20 (×4): 40 mg via SUBCUTANEOUS
  Filled 2014-12-17 (×4): qty 0.4

## 2014-12-17 MED ORDER — SODIUM CHLORIDE 0.9 % IV BOLUS (SEPSIS)
1000.0000 mL | Freq: Once | INTRAVENOUS | Status: AC
  Administered 2014-12-17: 1000 mL via INTRAVENOUS

## 2014-12-17 MED ORDER — ONDANSETRON HCL 4 MG PO TABS: 4.0000 mg | ORAL_TABLET | Freq: Four times a day (QID) | ORAL | Status: DC | PRN

## 2014-12-17 MED ORDER — FOLIC ACID 1 MG PO TABS
1.0000 mg | ORAL_TABLET | Freq: Every day | ORAL | Status: DC
  Administered 2014-12-17 – 2014-12-20 (×4): 1 mg via ORAL
  Filled 2014-12-17 (×4): qty 1

## 2014-12-17 MED ORDER — HYDROCODONE-ACETAMINOPHEN 5-325 MG PO TABS
1.0000 | ORAL_TABLET | ORAL | Status: DC | PRN
  Administered 2014-12-17 – 2014-12-19 (×2): 2 via ORAL
  Filled 2014-12-17 (×2): qty 2

## 2014-12-17 MED ORDER — SODIUM CHLORIDE 0.9 % IV SOLN
INTRAVENOUS | Status: AC
  Administered 2014-12-17: 18:00:00 via INTRAVENOUS

## 2014-12-17 MED ORDER — ONDANSETRON HCL 4 MG/2ML IJ SOLN
4.0000 mg | Freq: Once | INTRAMUSCULAR | Status: AC
  Administered 2014-12-17: 4 mg via INTRAVENOUS
  Filled 2014-12-17: qty 2

## 2014-12-17 MED ORDER — AMLODIPINE BESYLATE 10 MG PO TABS
10.0000 mg | ORAL_TABLET | Freq: Every day | ORAL | Status: DC
  Administered 2014-12-17 – 2014-12-21 (×5): 10 mg via ORAL
  Filled 2014-12-17 (×3): qty 1
  Filled 2014-12-17: qty 2
  Filled 2014-12-17: qty 1

## 2014-12-17 NOTE — ED Notes (Signed)
Trying to obtain EKG. Too much artifact, unable to obtain. Will continue to try

## 2014-12-17 NOTE — ED Notes (Signed)
Gave pt [place] sandwich and coffee. Hospitalist at bedside

## 2014-12-17 NOTE — ED Notes (Signed)
Pt covered in feces upon arrival. EMS states pt was laying in large amounts of stool in bathroom. Pt states he believes he was having a BM when he passed out

## 2014-12-17 NOTE — ED Notes (Signed)
Bathed pt. Placed new linens. Pt has stage 1 nonblanching pressure ulcer on sacrum. Skin intact, skin is purple. Pt has multiple scratch marks and small red dots (similar to bed bug bites) all over buttocks. Pt states he has had bed bugs. No actual bugs noted on pt.

## 2014-12-17 NOTE — ED Provider Notes (Signed)
CSN: [id number]     Arrival date & time 12/17/14  1409 History   First MD Initiated Contact with Patient 12/17/14 1455     Chief Complaint  Patient presents with  . Fall     (Consider location/radiation/quality/duration/timing/severity/associated sxs/prior Treatment) Patient is a 69 y.o. male presenting with fall. The history is provided by the patient and a relative.  Fall Associated symptoms include headaches. Pertinent negatives include no chest pain, no abdominal pain and no shortness of breath.  Patient w hx cva with right hemiparesis, c/o fall in bathroom this morning.  Pt lives independently, and gets around w motorized chair, can stand for transfer. This morning had several diarrheal stools, was on toilet, and says then he ended up on floor, and was unable to get up under own power.  Pt somewhat limited/poor historian - is unsure whether mechanical fall vs syncope.  Pt was on ground for 3-4 hours before family found him. Pt c/o dull headache post fall, as well as neck pain. No radicular pain. No new numbness/weakness. Other than diarrhea this AM, recent health c/w baseline. Pt denies recent febrile illness. No preceding faintness or dizziness. No palpitations or sense of rapid or irregular heartbeat. No recent change in meds or new meds. No recent abx use. Denies change in vision, speech, or baseline functional ability. No chest pain. No sob. No cough or uri c/o. No abd pain. No nv. No dysuria. No fever or chills.       Past Medical History  Diagnosis Date  . Hypertension   . Memory loss   . Stroke     Right sided hemiparesis   Past Surgical History  Procedure Laterality Date  . Hand surgery     Family History  Problem Relation Age of Onset  . Alzheimer's disease Mother 37  . Heart failure Father 66  . Immunocompromised Sister   . COPD Sister   . Migraines Daughter   . Immunocompromised Daughter   . Stroke Maternal Grandmother    History  Substance Use Topics  .  Smoking status: Former Smoker    Quit date: 04/26/2010  . Smokeless tobacco: Never Used  . Alcohol Use: 16.8 oz/week    28 Cans of beer per week     Comment: Pt drinks daily    Review of Systems  Constitutional: Negative for fever.  HENT: Negative for sore throat.   Eyes: Negative for visual disturbance.  Respiratory: Negative for shortness of breath.   Cardiovascular: Negative for chest pain, palpitations and leg swelling.  Gastrointestinal: Positive for diarrhea. Negative for vomiting and abdominal pain.  Endocrine: Negative for polyuria.  Genitourinary: Negative for flank pain.  Musculoskeletal: Negative for back pain and neck pain.  Skin: Negative for rash.  Neurological: Positive for headaches. Negative for numbness.       No new numbness/weakness.   Hematological: Does not bruise/bleed easily.  Psychiatric/Behavioral: Negative for confusion.      Allergies  Review of patient's allergies indicates no known allergies.  Home Medications   Prior to Admission medications   Medication Sig Start Date End Date Taking? Authorizing Provider  amLODipine (NORVASC) 10 MG tablet Take 1 tablet (10 mg total) by mouth daily. 11/14/14   Golden Circle, FNP  aspirin EC 81 MG tablet Take 81 mg by mouth daily.    Historical Provider, MD  baclofen (LIORESAL) 10 MG tablet Take 1 tablet (10 mg total) by mouth 4 (four) times daily. 09/27/14   Pieter Partridge, DO  Cholecalciferol (VITAMIN D3 PO) Take 1 tablet by mouth daily.    Historical Provider, MD  FOLIC ACID PO Take 1 tablet by mouth daily.    Historical Provider, MD  lisinopril (PRINIVIL,ZESTRIL) 40 MG tablet Take 1 tablet (40 mg total) by mouth every morning. 11/14/14   Golden Circle, FNP  pravastatin (PRAVACHOL) 80 MG tablet Take 1 tablet (80 mg total) by mouth daily. 11/14/14   Golden Circle, FNP  Thiamine HCl (VITAMIN B-1 PO) Take 1 tablet by mouth daily.    Historical Provider, MD   BP 172/98 mmHg  Ht '5\' 9"'$  (1.753 m)  Wt 125 lb  (56.7 kg)  BMI 18.45 kg/m2  SpO2 100% Physical Exam  Constitutional: He is oriented to person, place, and time. He appears well-developed and well-nourished. No distress.  HENT:  Head: Atraumatic.  Mouth/Throat: Oropharynx is clear and moist.  Eyes: Conjunctivae are normal. Pupils are equal, round, and reactive to light. No scleral icterus.  Neck: Normal range of motion. Neck supple. No tracheal deviation present.  Cardiovascular: Normal rate, regular rhythm, normal heart sounds and intact distal pulses.  Exam reveals no gallop and no friction rub.   No murmur heard. Pulmonary/Chest: Effort normal and breath sounds normal. No accessory muscle usage. No respiratory distress. He exhibits no tenderness.  Abdominal: Soft. Bowel sounds are normal. He exhibits no distension and no mass. There is no tenderness. There is no rebound and no guarding.  Genitourinary:  No cva tenderness  Musculoskeletal: Normal range of motion. He exhibits no edema.  Mid to lower cervical tenderness, otherwise CTLS spine, non tender, aligned, no step off. Right upper ext contracture/chronic/no change per pt/family.  Good rom other ext without pain or focal bony tenderness. Distal pulses palp bil.     Neurological: He is alert and oriented to person, place, and time.  Right UE contracture. Right hemiparesis. LUE/LLE motor 5/5, sens grossly intact.   Skin: Skin is warm and dry. No rash noted. He is not diaphoretic.  Psychiatric: He has a normal mood and affect.  Nursing note and vitals reviewed.   ED Course  Procedures (including critical care time) Labs Review  Results for orders placed or performed during the hospital encounter of 12/17/14  Urinalysis, Routine w reflex microscopic  Result Value Ref Range   Color, Urine YELLOW YELLOW   APPearance CLEAR CLEAR   Specific Gravity, Urine 1.016 1.005 - 1.030   pH 6.0 5.0 - 8.0   Glucose, UA NEGATIVE NEGATIVE mg/dL   Hgb urine dipstick LARGE (A) NEGATIVE    Bilirubin Urine NEGATIVE NEGATIVE   Ketones, ur 15 (A) NEGATIVE mg/dL   Protein, ur 100 (A) NEGATIVE mg/dL   Urobilinogen, UA 0.2 0.0 - 1.0 mg/dL   Nitrite NEGATIVE NEGATIVE   Leukocytes, UA NEGATIVE NEGATIVE  CBC  Result Value Ref Range   WBC 12.4 (H) 4.0 - 10.5 K/uL   RBC 5.13 4.22 - 5.81 MIL/uL   Hemoglobin 15.7 13.0 - 17.0 g/dL   HCT 43.0 39.0 - 52.0 %   MCV 83.8 78.0 - 100.0 fL   MCH 30.6 26.0 - 34.0 pg   MCHC 36.5 (H) 30.0 - 36.0 g/dL   RDW 11.9 11.5 - 15.5 %   Platelets 304 150 - 400 K/uL  Comprehensive metabolic panel  Result Value Ref Range   Sodium 121 (L) 135 - 145 mmol/L   Potassium 3.6 3.5 - 5.1 mmol/L   Chloride 83 (L) 101 - 111 mmol/L   CO2 24  22 - 32 mmol/L   Glucose, Bld 94 65 - 99 mg/dL   BUN 6 6 - 20 mg/dL   Creatinine, Ser 1.01 0.61 - 1.24 mg/dL   Calcium 8.5 (L) 8.9 - 10.3 mg/dL   Total Protein 7.7 6.5 - 8.1 g/dL   Albumin 4.2 3.5 - 5.0 g/dL   AST 166 (H) 15 - 41 U/L   ALT 40 17 - 63 U/L   Alkaline Phosphatase 96 38 - 126 U/L   Total Bilirubin 1.0 0.3 - 1.2 mg/dL   GFR calc non Af Amer >60 >60 mL/min   GFR calc Af Amer >60 >60 mL/min   Anion gap 14 5 - 15  Urine microscopic-add on  Result Value Ref Range   Squamous Epithelial / LPF RARE RARE   WBC, UA 3-6 <3 WBC/hpf   RBC / HPF 0-2 <3 RBC/hpf   Bacteria, UA RARE RARE   Ct Head Wo Contrast  12/17/2014   CLINICAL DATA:  Fall.  EXAM: CT HEAD WITHOUT CONTRAST  CT CERVICAL SPINE WITHOUT CONTRAST  TECHNIQUE: Multidetector CT imaging of the head and cervical spine was performed following the standard protocol without intravenous contrast. Multiplanar CT image reconstructions of the cervical spine were also generated.  COMPARISON:  08/16/2014 head CT.  FINDINGS: CT HEAD FINDINGS  There is atrophy and chronic small vessel disease changes. Old bilateral thalamic lacunar infarcts. No acute intracranial abnormality. Specifically, no hemorrhage, hydrocephalus, mass lesion, acute infarction, or significant  intracranial injury. No acute calvarial abnormality. Visualized paranasal sinuses and mastoids clear. Orbital soft tissues unremarkable.  CT CERVICAL SPINE FINDINGS  Normal alignment. Degenerative disc disease changes diffusely with disc space narrowing and spurring. Prevertebral soft tissues are normal. No fracture. No epidural or paraspinal hematoma. Dense carotid vascular calcifications bilaterally. Small calcified and noncalcified nodules within the thyroid. Scarring noted in the apices of the lungs bilaterally with emphysematous changes.  IMPRESSION: No acute intracranial abnormality. Atrophy, chronic small vessel disease.  No acute bony abnormality in the cervical spine.   Electronically Signed   By: Rolm Baptise M.D.   On: 12/17/2014 16:08   Ct Cervical Spine Wo Contrast  12/17/2014   CLINICAL DATA:  Fall.  EXAM: CT HEAD WITHOUT CONTRAST  CT CERVICAL SPINE WITHOUT CONTRAST  TECHNIQUE: Multidetector CT imaging of the head and cervical spine was performed following the standard protocol without intravenous contrast. Multiplanar CT image reconstructions of the cervical spine were also generated.  COMPARISON:  08/16/2014 head CT.  FINDINGS: CT HEAD FINDINGS  There is atrophy and chronic small vessel disease changes. Old bilateral thalamic lacunar infarcts. No acute intracranial abnormality. Specifically, no hemorrhage, hydrocephalus, mass lesion, acute infarction, or significant intracranial injury. No acute calvarial abnormality. Visualized paranasal sinuses and mastoids clear. Orbital soft tissues unremarkable.  CT CERVICAL SPINE FINDINGS  Normal alignment. Degenerative disc disease changes diffusely with disc space narrowing and spurring. Prevertebral soft tissues are normal. No fracture. No epidural or paraspinal hematoma. Dense carotid vascular calcifications bilaterally. Small calcified and noncalcified nodules within the thyroid. Scarring noted in the apices of the lungs bilaterally with emphysematous  changes.  IMPRESSION: No acute intracranial abnormality. Atrophy, chronic small vessel disease.  No acute bony abnormality in the cervical spine.   Electronically Signed   By: Rolm Baptise M.D.   On: 12/17/2014 16:08      ED ECG REPORT   Date: 12/17/2014  Rate: 93  Rhythm: normal sinus rhythm  QRS Axis: left  Intervals: normal  ST/T Wave abnormalities:  normal  Conduction Disutrbances:none  Narrative Interpretation:   Old EKG Reviewed: unchanged  I have personally reviewed the EKG tracing   MDM   Iv ns bolus. Labs.   Reviewed nursing notes and prior charts for additional history.   Labs. Imaging.  Pt requests pain med. Morphine iv. zofran iv.  Recheck pain improved.  No dysrhythmia noted on monitor.   Recheck spine nt. Ct neg. Mild left upper back muscular tenderness, no midline/spine tenderness.  Na quite low (121)-  Iv ns bolus. ?recent diarrhea w poor po intake.  Given fall, syncope vs mech fall, prolonged period on ground, low Na - will admit.  Hospitalists paged for admission.     Lajean Saver, MD 12/17/14 [id number]

## 2014-12-17 NOTE — H&P (Signed)
History and Physical        Hospital Admission Note Date: 12/17/2014  Patient name: Hector Rice Medical record number: [id number] Date of birth: 09/19/1945 Age: 69 y.o. Gender: male  PCP: Mauricio Po, FNP  Referring physician: Dr Ashok Cordia  Chief Complaint:  Passed out in the bathroom  HPI: Patient is a 69 year old male with prior history of CVA with right-sided hemiparesis, hypertension, ? Dementia presented to ED after syncopal episode today. Patient is accompanied with his son-in-law and lives independently at home, ambulates with a motorized chair. Patient reports that he was in his normal state of health until this morning. Patient noted that around 8 AM he started having several loose watery bowel movements. He reported that he felt dizzy and lightheaded and may have passed out. Patient's family called him and as he did not return his calls, his sister came to his house at 1 PM and found him on the floor. Patient is a poor historian, unsure if he had mechanical fall or syncopal episode. He states that he may have hit his head and back. He has chronic right-sided hemiparesis, states that he felt more weaker on that side. He denied any fevers or chills or abdominal pain, hematochezia or melena. He denied any recent antibiotics. He denied any chest pain or shortness of breath or palpitations prior to this event. Patient also reports pain in his thoracic area after the fall. ED note reviewed. ED workup showed sodium of 121 , appears to be chronic hyponatremia, baseline around 127, AST 166, CK 6448.  CT head and CT C-spine were normal  Review of Systems:  Constitutional: Denies fever, chills, diaphoresis, poor appetite and fatigue.  HEENT: Denies photophobia, eye pain, redness, hearing loss, ear pain, congestion, sore throat, rhinorrhea, sneezing, mouth sores, trouble swallowing, neck  pain, neck stiffness and tinnitus.   Respiratory: Denies SOB, DOE, cough, chest tightness,  and wheezing.   Cardiovascular: Denies chest pain, palpitations and leg swelling.  Gastrointestinal: Denies nausea, vomiting, abdominal pain, blood in stool and abdominal distention. + diarrhea  Genitourinary: Denies dysuria, urgency, frequency, hematuria, flank pain and difficulty urinating.  Musculoskeletal: Denies myalgias, joint swelling, arthralgias and gait problem. + back pain  Skin: Denies pallor, rash and wound.  Neurological: Denies  headaches. + chronic right-sided weakness, please see history of present illness  Hematological: Denies adenopathy. Easy bruising, personal or family bleeding history  Psychiatric/Behavioral: Denies suicidal ideation, mood changes, confusion, nervousness, sleep disturbance and agitation  Past Medical History: Past Medical History  Diagnosis Date  . Hypertension   . Memory loss   . Stroke     Right sided hemiparesis    Past Surgical History  Procedure Laterality Date  . Hand surgery      Medications: Prior to Admission medications   Medication Sig Start Date End Date Taking? Authorizing Provider  amLODipine (NORVASC) 10 MG tablet Take 1 tablet (10 mg total) by mouth daily. 11/14/14  Yes Golden Circle, FNP  aspirin EC 81 MG tablet Take 81 mg by mouth daily.   Yes Historical Provider, MD  baclofen (LIORESAL) 10 MG tablet Take 1 tablet (10 mg total) by mouth 4 (four) times daily. Patient  taking differently: Take 10 mg by mouth daily as needed for muscle spasms.  09/27/14  Yes Pieter Partridge, DO  Cholecalciferol (VITAMIN D3 PO) Take 1 tablet by mouth daily.   Yes Historical Provider, MD  FOLIC ACID PO Take 1 tablet by mouth daily.   Yes Historical Provider, MD  lisinopril (PRINIVIL,ZESTRIL) 40 MG tablet Take 1 tablet (40 mg total) by mouth every morning. 11/14/14  Yes Golden Circle, FNP  pravastatin (PRAVACHOL) 80 MG tablet Take 1 tablet (80 mg total) by  mouth daily. 11/14/14  Yes Golden Circle, FNP  Thiamine HCl (VITAMIN B-1 PO) Take 1 tablet by mouth daily.   Yes Historical Provider, MD    Allergies:  No Known Allergies  Social History:  reports that he quit smoking about 4 years ago. He has never used smokeless tobacco. He reports that he drinks about 16.8 oz of alcohol per week. He reports that he does not use illicit drugs.Currently lives independently and ambulates with a motorized chair  Family History: Family history reviewed with the patient  Family History  Problem Relation Age of Onset  . Alzheimer's disease Mother 73  . Heart failure Father 71  . Immunocompromised Sister   . COPD Sister   . Migraines Daughter   . Immunocompromised Daughter   . Stroke Maternal Grandmother     Physical Exam: Blood pressure 147/92, pulse 97, temperature 97.5 F (36.4 C), temperature source Oral, resp. rate 21, height '5\' 9"'$  (1.753 m), weight 56.7 kg (125 lb), SpO2 98 %. General: Alert, awake, oriented x3, in no acute distress. HEENT: normocephalic, atraumatic, anicteric sclera, pink conjunctiva, pupils equal and reactive to light and accomodation, oropharynx clear Neck: supple, no masses or lymphadenopathy, no goiter, no bruits  Heart: Regular rate and rhythm, without murmurs, rubs or gallops. Lungs: Clear to auscultation bilaterally, no wheezing, rales or rhonchi. Abdomen: Soft, nontender, nondistended, positive bowel sounds, no masses. Extremities: No clubbing, cyanosis or edema with positive pedal pulses. Back : Mild lower cervical tenderness, mild thoracic spine tenderness  Neuro: right upper extremity contracture, right sided hemiparesis from prior CVA, left upper extremities and left lower extremity 5/5  Psych: alert and oriented x 3, normal mood and affect Skin: no rashes or lesions, warm and dry   LABS on Admission:  Basic Metabolic Panel:  Recent Labs Lab 12/17/14 1520  NA 121*  K 3.6  CL 83*  CO2 24  GLUCOSE 94    BUN 6  CREATININE 1.01  CALCIUM 8.5*   Liver Function Tests:  Recent Labs Lab 12/17/14 1520  AST 166*  ALT 40  ALKPHOS 96  BILITOT 1.0  PROT 7.7  ALBUMIN 4.2   No results for input(s): LIPASE, AMYLASE in the last 168 hours. No results for input(s): AMMONIA in the last 168 hours. CBC:  Recent Labs Lab 12/17/14 1520  WBC 12.4*  HGB 15.7  HCT 43.0  MCV 83.8  PLT 304   Cardiac Enzymes:  Recent Labs Lab 12/17/14 1531  CKTOTAL 6448*   BNP: Invalid input(s): POCBNP CBG: No results for input(s): GLUCAP in the last 168 hours.  Radiological Exams on Admission:  Ct Head Wo Contrast  12/17/2014   CLINICAL DATA:  Fall.  EXAM: CT HEAD WITHOUT CONTRAST  CT CERVICAL SPINE WITHOUT CONTRAST  TECHNIQUE: Multidetector CT imaging of the head and cervical spine was performed following the standard protocol without intravenous contrast. Multiplanar CT image reconstructions of the cervical spine were also generated.  COMPARISON:  08/16/2014 head  CT.  FINDINGS: CT HEAD FINDINGS  There is atrophy and chronic small vessel disease changes. Old bilateral thalamic lacunar infarcts. No acute intracranial abnormality. Specifically, no hemorrhage, hydrocephalus, mass lesion, acute infarction, or significant intracranial injury. No acute calvarial abnormality. Visualized paranasal sinuses and mastoids clear. Orbital soft tissues unremarkable.  CT CERVICAL SPINE FINDINGS  Normal alignment. Degenerative disc disease changes diffusely with disc space narrowing and spurring. Prevertebral soft tissues are normal. No fracture. No epidural or paraspinal hematoma. Dense carotid vascular calcifications bilaterally. Small calcified and noncalcified nodules within the thyroid. Scarring noted in the apices of the lungs bilaterally with emphysematous changes.  IMPRESSION: No acute intracranial abnormality. Atrophy, chronic small vessel disease.  No acute bony abnormality in the cervical spine.   Electronically Signed    By: Rolm Baptise M.D.   On: 12/17/2014 16:08   Ct Cervical Spine Wo Contrast  12/17/2014   CLINICAL DATA:  Fall.  EXAM: CT HEAD WITHOUT CONTRAST  CT CERVICAL SPINE WITHOUT CONTRAST  TECHNIQUE: Multidetector CT imaging of the head and cervical spine was performed following the standard protocol without intravenous contrast. Multiplanar CT image reconstructions of the cervical spine were also generated.  COMPARISON:  08/16/2014 head CT.  FINDINGS: CT HEAD FINDINGS  There is atrophy and chronic small vessel disease changes. Old bilateral thalamic lacunar infarcts. No acute intracranial abnormality. Specifically, no hemorrhage, hydrocephalus, mass lesion, acute infarction, or significant intracranial injury. No acute calvarial abnormality. Visualized paranasal sinuses and mastoids clear. Orbital soft tissues unremarkable.  CT CERVICAL SPINE FINDINGS  Normal alignment. Degenerative disc disease changes diffusely with disc space narrowing and spurring. Prevertebral soft tissues are normal. No fracture. No epidural or paraspinal hematoma. Dense carotid vascular calcifications bilaterally. Small calcified and noncalcified nodules within the thyroid. Scarring noted in the apices of the lungs bilaterally with emphysematous changes.  IMPRESSION: No acute intracranial abnormality. Atrophy, chronic small vessel disease.  No acute bony abnormality in the cervical spine.   Electronically Signed   By: Rolm Baptise M.D.   On: 12/17/2014 16:08    *I have personally reviewed the images above*  EKG: Independently reviewed.Rate 93, normal sinus rhythm, no acute ST-T wave changes suggestive of ischemia  Assessment/Plan Principal Problem:   Syncope: Likely due to dehydration, vasovagal episode, hyponatremia. Patient however is a poor historian and does not remember much of the event. He has a prior history of CVA, unclear if he had any seizure. - Admit to telemetry, obtain serial cardiac enzymes, IV fluid hydration, stool  studies - Obtain 2-D echo, patient also reported slightly more weakness on the right side today, hence rule out CVA, obtain MRI of the brain, EEG -PTOT evaluation   Active Problems:   Essential hypertension - Currently stable, continue amlodipine    Right spastic hemiparesis, prior history of CVA - Follow MRI of the brain, continue aspirin, statin    HyponatremiaWith history of chronic hyponatremia, baseline 127 - Obtain serum osmotic, urine osmolarity, placed on IV fluid hydration    Rhabdomyolysis: The patient was on the floor for several hours before found by the family, no renal insufficiency - Continue IV fluid hydration, follow CK   DVT prophylaxis: Lovenox   CODE STATUS: DO NOT RESUSCITATE, discussed with the patient   Family Communication: Admission, patients condition and plan of care including tests being ordered have been discussed with the patient and son-in-lawho indicates understanding and agree with the plan and Code Status  Disposition plan: Further plan will depend as patient's clinical course evolves  and further radiologic and laboratory data become available.   Time Spent on Admission: 1 hour  Brandyn Lowrey M.D. [facility] 12/17/2014, 6:24 PM Pager: [phone]  If 7PM-7AM, please contact night-coverage [email or web address] Password [email or web address]

## 2014-12-17 NOTE — ED Notes (Signed)
Unable to attain a legible EKG.  Artifact present while disturbing the true image.

## 2014-12-17 NOTE — ED Notes (Signed)
Pt from home. Pt's sisters found him in bathroom- had been trying to reach him since 11 am. Pt states he was using bathroom- believes he fell from standing position, did not remember passing out. Pt's sisters report he is at his mental baseline. Pt complaining of neck pain. Pt has hx of stroke with right side deficits. Pt denies CP, SOB. Pt has hx of afib and is on blood thinner. (unsure of name). BP 170/110, HR 110, CBG 92, 98% on room air.

## 2014-12-18 ENCOUNTER — Inpatient Hospital Stay (HOSPITAL_COMMUNITY): Payer: Medicare Other

## 2014-12-18 DIAGNOSIS — R55 Syncope and collapse: Secondary | ICD-10-CM | POA: Diagnosis not present

## 2014-12-18 DIAGNOSIS — I6320 Cerebral infarction due to unspecified occlusion or stenosis of unspecified precerebral arteries: Secondary | ICD-10-CM

## 2014-12-18 DIAGNOSIS — E43 Unspecified severe protein-calorie malnutrition: Secondary | ICD-10-CM | POA: Insufficient documentation

## 2014-12-18 DIAGNOSIS — T796XXS Traumatic ischemia of muscle, sequela: Secondary | ICD-10-CM

## 2014-12-18 DIAGNOSIS — E871 Hypo-osmolality and hyponatremia: Secondary | ICD-10-CM

## 2014-12-18 LAB — COMPREHENSIVE METABOLIC PANEL
ALT: 30 U/L (ref 17–63)
AST: 97 U/L — ABNORMAL HIGH (ref 15–41)
Albumin: 3.1 g/dL — ABNORMAL LOW (ref 3.5–5.0)
Alkaline Phosphatase: 69 U/L (ref 38–126)
Anion gap: 5 (ref 5–15)
BUN: 7 mg/dL (ref 6–20)
CALCIUM: 8.1 mg/dL — AB (ref 8.9–10.3)
CO2: 26 mmol/L (ref 22–32)
Chloride: 95 mmol/L — ABNORMAL LOW (ref 101–111)
Creatinine, Ser: 1.07 mg/dL (ref 0.61–1.24)
GFR calc non Af Amer: >60 mL/min (ref >60)
Glucose, Bld: 82 mg/dL (ref 65–99)
POTASSIUM: 4.7 mmol/L (ref 3.5–5.1)
SODIUM: 126 mmol/L — AB (ref 135–145)
Total Bilirubin: 0.7 mg/dL (ref 0.3–1.2)
Total Protein: 5.9 g/dL — ABNORMAL LOW (ref 6.5–8.1)

## 2014-12-18 LAB — CK TOTAL AND CKMB (NOT AT ARMC)
CK TOTAL: 2722 U/L — AB (ref 49–397)
CK, MB: 40.4 ng/mL — ABNORMAL HIGH (ref 0.5–5.0)
CK, MB: 27.5 ng/mL — AB (ref 0.5–5.0)
RELATIVE INDEX: 1.4 (ref 0.0–2.5)
Relative Index: 1.5 (ref 0.0–2.5)
Total CK: 1938 U/L — ABNORMAL HIGH (ref 49–397)

## 2014-12-18 LAB — CK: Total CK: 1652 U/L — ABNORMAL HIGH (ref 49–397)

## 2014-12-18 LAB — OSMOLALITY, URINE: OSMOLALITY UR: 232 mosm/kg — AB (ref 390–1090)

## 2014-12-18 LAB — CBC
HEMATOCRIT: 37 % — AB (ref 39.0–52.0)
Hemoglobin: 13.2 g/dL (ref 13.0–17.0)
MCH: 30.2 pg (ref 26.0–34.0)
MCHC: 35.7 g/dL (ref 30.0–36.0)
MCV: 84.7 fL (ref 78.0–100.0)
Platelets: 241 10*3/uL (ref 150–400)
RBC: 4.37 MIL/uL (ref 4.22–5.81)
RDW: 12.3 % (ref 11.5–15.5)
WBC: 8.6 10*3/uL (ref 4.0–10.5)

## 2014-12-18 LAB — T4, FREE: Free T4: 1.17 ng/dL — ABNORMAL HIGH (ref 0.61–1.12)

## 2014-12-18 LAB — TROPONIN I: Troponin I: <0.03 ng/mL (ref <0.031)

## 2014-12-18 LAB — TSH: TSH: 0.86 u[IU]/mL (ref 0.350–4.500)

## 2014-12-18 MED ORDER — PRO-STAT SUGAR FREE PO LIQD
30.0000 mL | Freq: Two times a day (BID) | ORAL | Status: DC
  Administered 2014-12-18 – 2014-12-21 (×6): 30 mL via ORAL
  Filled 2014-12-18 (×12): qty 30

## 2014-12-18 MED ORDER — ADULT MULTIVITAMIN W/MINERALS CH
1.0000 | ORAL_TABLET | Freq: Every day | ORAL | Status: DC
  Administered 2014-12-18 – 2014-12-20 (×3): 1 via ORAL
  Filled 2014-12-18 (×3): qty 1

## 2014-12-18 MED ORDER — ATORVASTATIN CALCIUM 80 MG PO TABS: 80.0000 mg | ORAL_TABLET | Freq: Every day | ORAL | Status: DC

## 2014-12-18 MED ORDER — ASPIRIN EC 325 MG PO TBEC
325.0000 mg | DELAYED_RELEASE_TABLET | Freq: Every day | ORAL | Status: DC
  Administered 2014-12-19: 325 mg via ORAL
  Filled 2014-12-18: qty 1

## 2014-12-18 NOTE — Progress Notes (Signed)
Initial Nutrition Assessment  DOCUMENTATION CODES:  Severe malnutrition in context of chronic illness, Underweight  INTERVENTION:  Prostat, MVI BID  NUTRITION DIAGNOSIS:  Malnutrition related to chronic illness as evidenced by severe depletion of muscle mass, percent weight loss.  GOAL:  Patient will meet greater than or equal to 90% of their needs  MONITOR:  Supplement acceptance, Labs, Weight trends, I & O's, Skin  REASON FOR ASSESSMENT:  Malnutrition Screening Tool, Other (Comment) (Low BMI)    ASSESSMENT: Pt is a 69 year old male with prior history of CVA with right-sided hemiparesis, hypertension, ? Dementia presented to ED after syncopal episode   Pt is preparing for MRI at time of visit. Pt states he never had a good appetite. Per admission records pt drinks about 4 beers per day, possibly contributing to poor food intake. Per pt and nursing notes pt ate 100% of his meals today.  Pt states his usual weight is 145 Lb (23 Lb loss in 5-6 mo, significant for time frame), but there is no wt history to confirm. Pt does not like Boost or Ensure type drinks, refuses snacks, but is agreable to trying Prostat BID. Will order MVI as well.  Nutrition Focused Physical Exam showed moderate to severe fat and muscle mass depletion. Encouraged pt to order additional items on trays to have things to snack on and ask for snacks on the floor.  Will continue to monitor. Labs reviewed: Na 126, Ca 8.1  Height:  Ht Readings from Last 1 Encounters:  12/17/14 '5\' 9"'$  (1.753 m)    Weight:  Wt Readings from Last 1 Encounters:  12/17/14 122 lb 1.6 oz (55.384 kg)    Ideal Body Weight:  72.7 kg  Wt Readings from Last 10 Encounters:  12/17/14 122 lb 1.6 oz (55.384 kg)  11/14/14 125 lb (56.7 kg)  09/27/14 125 lb 9.6 oz (56.972 kg)    BMI:  Body mass index is 18.02 kg/(m^2).  Estimated Nutritional Needs:  Kcal:  1700 - 1900  Protein:  85 - 100 g  Fluid:  2.0 L  Skin:  Reviewed,  no issues  Diet Order:  Diet Heart Room service appropriate?: Yes; Fluid consistency:: Thin  EDUCATION NEEDS:  No education needs identified at this time   Intake/Output Summary (Last 24 hours) at 12/18/14 1453 Last data filed at 12/18/14 0840  Gross per 24 hour  Intake    240 ml  Output    300 ml  Net    -60 ml    Last BM:  5/23  Jarel Cuadra A. [place] [facility] Intern Pager: [phone] 12/18/2014 2:59 PM

## 2014-12-18 NOTE — Progress Notes (Signed)
Utilization review complete. Zeynab Klett RN CCM Case Mgmt phone [phone] 

## 2014-12-18 NOTE — Evaluation (Signed)
Physical Therapy Evaluation Patient Details Name: Hector Rice MRN: [id number] DOB: 02/17/1946 Today's Date: 12/18/2014   History of Present Illness  Patient is a 69 year old male with prior history of CVA with right-sided hemiparesis, hypertension, ? Dementia presented to ED after syncopal episode today. Patient is accompanied with his son-in-law and lives independently at home, ambulates with a motorized chair. Patient reports that he was in his normal state of health until this morning. Patient noted that around 8 AM he started having several loose watery bowel movements. He reported that he felt dizzy and lightheaded and may have passed out. Patient's family called him and as he did not return his calls, his sister came to his house at 1 PM and found him on the floor. Patient is a poor historian, unsure if he had mechanical fall or syncopal episode. He states that he may have hit his head and back. He has chronic right-sided hemiparesis, states that he felt more weaker on that side.   Clinical Impression  Pt admitted with above diagnosis. Pt currently with functional limitations due to the deficits listed below (see PT Problem List). Pt required min guard assist on eval for bed mobility, min assist for transfers, and min guard assist gait with RW 10 feet. While his method of ambulation is not ideal (guiding RW with just LUE), he has proved independence with living alone since CVA 5 years ago. He uses power w/c at home and ambulates short distances with RW due to his w/c not fitting in the bathroom. Pt will benefit from skilled PT to increase their independence and safety with mobility to allow discharge to the venue listed below.  Pt is adamant about returning home alone. He is agreeable to HHPT. Daughter expresses interest in ILF but pt is declining that option. Family would appreciate literature on ILFs in [place] as well as any available services to assist with errands, cooking, and light  housekeeping.     Follow Up Recommendations Home health PT;Supervision - Intermittent    Equipment Recommendations  None recommended by PT    Recommendations for Other Services       Precautions / Restrictions Precautions Precautions: Fall      Mobility  Bed Mobility Overal bed mobility: Needs Assistance Bed Mobility: Supine to Sit     Supine to sit: Min guard;HOB elevated     General bed mobility comments: use of bedrail, min guard for safety and cueing  Transfers Overall transfer level: Needs assistance Equipment used: Rolling walker (2 wheeled) Transfers: Sit to/from Stand Sit to Stand: Min assist         General transfer comment: Pt pulls up on RW. This is the method he has used since his CVA 5 years ago.  Ambulation/Gait Ambulation/Gait assistance: Min guard Ambulation Distance (Feet): 10 Feet Assistive device: Rolling walker (2 wheeled) Gait Pattern/deviations: Step-to pattern;Decreased stride length Gait velocity: decreased   General Gait Details: Pt only able to grasp RW with L hand. He guides and maneuvers RW with the LUE.  Stairs            Wheelchair Mobility    Modified Rankin (Stroke Patients Only)       Balance Overall balance assessment: Needs assistance Sitting-balance support: No upper extremity supported;Feet supported Sitting balance-Leahy Scale: Good     Standing balance support: Single extremity supported;During functional activity Standing balance-Leahy Scale: Poor  Pertinent Vitals/Pain Pain Assessment: No/denies pain    Home Living Family/patient expects to be discharged to:: Private residence Living Arrangements: Alone Available Help at Discharge: Family;Available PRN/intermittently Type of Home: House Home Access: Ramped entrance     Home Layout: One level Home Equipment: Walker - 2 wheels;Wheelchair - power      Prior Function Level of Independence: Independent  with assistive device(s)               Hand Dominance        Extremity/Trunk Assessment   Upper Extremity Assessment: Defer to OT evaluation           Lower Extremity Assessment: RLE deficits/detail RLE Deficits / Details: moves in synergy patterns. Able to extends but resting position is flexion at all joints. (right side weakness from CVA 5 years ago       Communication   Communication: Expressive difficulties  Cognition Arousal/Alertness: Awake/alert Behavior During Therapy: WFL for tasks assessed/performed Overall Cognitive Status: Within Functional Limits for tasks assessed                      General Comments      Exercises        Assessment/Plan    PT Assessment Patient needs continued PT services  PT Diagnosis Difficulty walking;Generalized weakness   PT Problem List Decreased strength;Decreased activity tolerance;Decreased balance;Decreased mobility;Decreased safety awareness;Decreased cognition  PT Treatment Interventions DME instruction;Gait training;Functional mobility training;Therapeutic activities;Therapeutic exercise;Patient/family education;Balance training   PT Goals (Current goals can be found in the Care Plan section) Acute Rehab PT Goals Patient Stated Goal: home PT Goal Formulation: With patient/family Time For Goal Achievement: 01/01/15 Potential to Achieve Goals: Good    Frequency Min 3X/week   Barriers to discharge        Co-evaluation               End of Session Equipment Utilized During Treatment: Gait belt Activity Tolerance: Patient tolerated treatment well Patient left: in chair;with family/visitor present;with call bell/phone within reach Nurse Communication: Mobility status         Time: 0908-0950 PT Time Calculation (min) (ACUTE ONLY): 42 min   Charges:   PT Evaluation $Initial PT Evaluation Tier I: 1 Procedure PT Treatments $Gait Training: 8-22 mins $Therapeutic Activity: 8-22 mins    PT G Codes:        Lorriane Shire 12/18/2014, 10:03 AM

## 2014-12-18 NOTE — Evaluation (Addendum)
Occupational Therapy Evaluation Patient Details Name: Hector Rice MRN: [id number] DOB: [birth year] Today's Date: 12/18/2014    History of Present Illness Patient is a 69 year old male with prior history of CVA with right-sided hemiparesis, hypertension, ? Dementia presented to ED after syncopal episode. MRI revealed small acute infarct involving left corona radiata.    Clinical Impression   Pt admitted with above. Pt independent with ADLs, PTA. Feel pt will benefit from acute OT to increase independence prior to d/c. Recommending HHOT upon d/c.     Follow Up Recommendations  Home health OT;Supervision/Assistance - 24 hour    Equipment Recommendations  3 in 1 bedside comode    Recommendations for Other Services       Precautions / Restrictions Precautions Precautions: Fall Restrictions Weight Bearing Restrictions: No      Mobility Bed Mobility Overal bed mobility: Needs Assistance Bed Mobility: Sit to Supine;Supine to Sit     Supine to sit: Supervision Sit to supine: Supervision   General bed mobility comments: supervision for IV/lines  Transfers Overall transfer level: Needs assistance Equipment used: Rolling walker (2 wheeled) Transfers: Sit to/from Stand Sit to Stand: Min assist;Mod assist         General transfer comment: assist to hold walker steady. tried standing from bed without pulling on walker and OT gave assistance to boost to standing and for balance.    Balance Min assist for balance when standing from bed on one occassion. Min guard for ambulation in room (short distance). Balance not formally assessed.                         ADL Overall ADL's : Needs assistance/impaired                     Lower Body Dressing: Minimal assistance;Sit to/from stand   Toilet Transfer: Min guard;Minimal assistance;Moderate assistance;Ambulation;RW (Min-Mod assist for sit to stand from bed;Min guard-ambulating)           Functional  mobility during ADLs: Min guard;Rolling walker General ADL Comments: Discussed possible splint for right hand-pt reports his strap broke on his. Pt able to state UB dressing technique. Mentioned getting BSC and pt not wanting one.  Pt ambulates with left hand on RW and has been doing so for years. OT asked individual about to give pt test to set bed alarm.     Vision Pt reports having lasik eye surgery; pt drives and reports no change in vision from baseline. Vision Assessment?: Yes  Visual fields: Inconsistent with visual fields on both sides (upper quadrant especially). Tested with one eye individually and pt did better (cues to keep looking at therapist nose). Tracking: decreased smoothness and lost stimulus on left side on one occasion.   Perception     Praxis      Pertinent Vitals/Pain Pain Assessment: Faces Pain Score: 0-No pain     Hand Dominance Left (since prior CVA)   Extremity/Trunk Assessment Upper Extremity Assessment Upper Extremity Assessment: RUE deficits/detail RUE Deficits / Details: residual right hemiparesis; increased tone RUE Sensation: decreased light touch RUE Coordination: decreased fine motor;decreased gross motor   Lower Extremity Assessment Lower Extremity Assessment: Defer to PT evaluation RLE Deficits / Details: moves in synergy patterns. Able to extends but resting position is flexion at all joints. (right side weakness from CVA 5 years ago       Communication Communication Communication: Expressive difficulties   Cognition Arousal/Alertness: Awake/alert Behavior During Therapy: [facility] for  tasks assessed/performed Overall Cognitive Status: Within Functional Limits for tasks assessed (decreased safety awareness and per chart, history of cognitive deficits at baseline-dementia)                     General Comments       Exercises       Shoulder Instructions      Home Living Family/patient expects to be discharged to:: Private  residence Living Arrangements: Alone Available Help at Discharge: Family;Available PRN/intermittently Type of Home: House Home Access: Ramped entrance     Home Layout: One level     Bathroom Shower/Tub: Tub/shower unit;Walk-in shower   Bathroom Toilet: Standard (sink and grab bar near) Bathroom Accessibility: Yes How Accessible: Accessible via walker (not via wheelchair) Home Equipment: Walker - 2 wheels;Wheelchair - power;Tub bench;Grab bars - toilet          Prior Functioning/Environment Level of Independence: Independent with assistive device(s)             OT Diagnosis: Generalized weakness   OT Problem List: Decreased strength;Impaired balance (sitting and/or standing);Decreased range of motion;Impaired tone;Impaired UE functional use;Decreased knowledge of precautions;Decreased knowledge of use of DME or AE;Decreased safety awareness;Impaired vision/perception;Decreased coordination   OT Treatment/Interventions: Self-care/ADL training;DME and/or AE instruction;Patient/family education;Balance training;Visual/perceptual remediation/compensation;Therapeutic exercise;Splinting;Therapeutic activities;Neuromuscular education    OT Goals(Current goals can be found in the care plan section) Acute Rehab OT Goals Patient Stated Goal: not stated OT Goal Formulation: With patient Time For Goal Achievement: 12/25/14 Potential to Achieve Goals: Good ADL Goals Pt Will Perform Lower Body Dressing: with set-up;sit to/from stand;with supervision Pt Will Transfer to Toilet: with supervision;ambulating;regular height toilet;grab bars Pt Will Perform Toileting - Clothing Manipulation and hygiene: with supervision;sit to/from stand Additional ADL Goal #1: Pt will participate in further vision assessment and be in agreement with OT recommendations.  OT Frequency: Min 2X/week   Barriers to D/C: Decreased caregiver support          Co-evaluation              End of Session  Equipment Utilized During Treatment: Gait belt;Rolling walker  Activity Tolerance: Patient tolerated treatment well Patient left: in bed;Other (comment);with call bell/phone within reach (with MD in room; individual about to do test)   Time: 7867-6720 OT Time Calculation (min): 17 min Charges:  OT General Charges $OT Visit: 1 Procedure OT Evaluation $Initial OT Evaluation Tier I: 1 Procedure G-CodesBenito Mccreedy OTR/L [phone] 12/18/2014, 11:07 AM

## 2014-12-18 NOTE — Procedures (Signed)
History: 69 yo M with new stroke, syncope  Sedation: None  Technique: This is a 19 channel routine scalp EEG performed at the bedside with bipolar and monopolar montages arranged in accordance to the international 10/20 system of electrode placement. One channel was dedicated to EKG recording.    Background: The background consists of intermixed alpha and beta activities. There is a well defined posterior dominant rhythm of 9 Hz that attenuates with eye opening. There is irregular delta activity maximal in teh right posterior quadrant. Sleep is not recorded.   Photic stimulation: Physiologic driving is not performed  EEG Abnormalities: 1) Right posterior quadrant delta activity.   Clinical Interpretation: This EEG is suggestive of right sided cerebral dysfunction. There was no seizure or seizure predisposition recorded on this study.   Roland Rack, MD [facility] [phone]  If 7pm- 7am, please page neurology on call as listed in [place].

## 2014-12-18 NOTE — Consult Note (Signed)
Stroke Consult    Chief Complaint: syncope HPI: Hector Rice is an 69 y.o. male hx of prior CVA (with residual right sided hemiparesis), HTN, cognitive decline presenting to the ED after syncopal episode. Per patient/family he began having loose watery stools and then may have had LOC. Family found patient on the floor. He reports feeling light headed prior to the episode. Has chronic right sided weakness but reports feeling more weak on that side.   MRI brain imaging reviewed. Shows small infarct in the left corona radiata.    Past Medical History  Diagnosis Date  . Hypertension   . Memory loss   . Stroke     Right sided hemiparesis    Past Surgical History  Procedure Laterality Date  . Hand surgery      Family History  Problem Relation Age of Onset  . Alzheimer's disease Mother 31  . Heart failure Father 42  . Immunocompromised Sister   . COPD Sister   . Migraines Daughter   . Immunocompromised Daughter   . Stroke Maternal Grandmother    Social History:  reports that he quit smoking about 4 years ago. He has never used smokeless tobacco. He reports that he drinks about 16.8 oz of alcohol per week. He reports that he does not use illicit drugs.  Allergies: No Known Allergies  Medications Prior to Admission  Medication Sig Dispense Refill  . amLODipine (NORVASC) 10 MG tablet Take 1 tablet (10 mg total) by mouth daily. 90 tablet 0  . aspirin EC 81 MG tablet Take 81 mg by mouth daily.    . baclofen (LIORESAL) 10 MG tablet Take 1 tablet (10 mg total) by mouth 4 (four) times daily. (Patient taking differently: Take 10 mg by mouth daily as needed for muscle spasms. ) 120 each 3  . Cholecalciferol (VITAMIN D3 PO) Take 1 tablet by mouth daily.    Marland Kitchen FOLIC ACID PO Take 1 tablet by mouth daily.    Marland Kitchen lisinopril (PRINIVIL,ZESTRIL) 40 MG tablet Take 1 tablet (40 mg total) by mouth every morning. 90 tablet 0  . pravastatin (PRAVACHOL) 80 MG tablet Take 1 tablet (80 mg total) by mouth  daily. 90 tablet 0  . Thiamine HCl (VITAMIN B-1 PO) Take 1 tablet by mouth daily.      ROS: Out of a complete 14 system review, the patient complains of only the following symptoms, and all other reviewed systems are negative.    Physical Examination: Filed Vitals:   12/18/14 0729  BP: 136/70  Pulse: 84  Temp: 98.5 F (36.9 C)  Resp: 18   Physical Exam  Constitutional: He appears well-developed and well-nourished.  Psych: Affect appropriate to situation Eyes: No scleral injection HENT: No OP obstrucion Head: Normocephalic.  Cardiovascular: Normal rate and regular rhythm.  Respiratory: Effort normal and breath sounds normal.  GI: Soft. Bowel sounds are normal. No distension. There is no tenderness.  Skin: WDI  Neurologic Examination: Mental Status: Alert, oriented, thought content appropriate.  Speech fluent without evidence of aphasia.  Able to follow 3 step commands without difficulty. Cranial Nerves: II: funduscopic exam wnl bilaterally, visual fields grossly normal, pupils equal, round, reactive to light and accommodation III,IV, VI: ptosis not present, extra-ocular motions intact bilaterally V,VII: smile symmetric, facial light touch sensation normal bilaterally VIII: hearing normal bilaterally IX,X: gag reflex present XI: trapezius strength/neck flexion strength normal bilaterally XII: tongue strength normal  Motor: LUE and LLE 5/5 strength RUE noted flexed posture with increased tone, minimal  movement noted RLE 5-/5 proximal, distal 5/5 Sensory: Pinprick and light touch intact throughout, bilaterally Deep Tendon Reflexes: brisk right sided reflexes Plantars: Right: downgoing   Left: downgoing Cerebellar: Unable to test FTN on the right, otherwise unremarkable Gait: deferred  Laboratory Studies:   Basic Metabolic Panel:  Recent Labs Lab 12/17/14 1520 12/18/14 0635  NA 121* 126*  K 3.6 4.7  CL 83* 95*  CO2 24 26  GLUCOSE 94 82  BUN 6 7   CREATININE 1.01 1.07  CALCIUM 8.5* 8.1*    Liver Function Tests:  Recent Labs Lab 12/17/14 1520 12/18/14 0635  AST 166* 97*  ALT 40 30  ALKPHOS 96 69  BILITOT 1.0 0.7  PROT 7.7 5.9*  ALBUMIN 4.2 3.1*   No results for input(s): LIPASE, AMYLASE in the last 168 hours. No results for input(s): AMMONIA in the last 168 hours.  CBC:  Recent Labs Lab 12/17/14 1520 12/18/14 0635  WBC 12.4* 8.6  HGB 15.7 13.2  HCT 43.0 37.0*  MCV 83.8 84.7  PLT 304 241    Cardiac Enzymes:  Recent Labs Lab 12/17/14 1531 12/17/14 1855 12/18/14 0012 12/18/14 0635  CKTOTAL 6448* 3517* 2722* 1938*  CKMB  --  61.4* 40.4* 27.5*  TROPONINI  --  <0.03 <0.03 <0.03    BNP: Invalid input(s): POCBNP  CBG: No results for input(s): GLUCAP in the last 168 hours.  Microbiology: Results for orders placed or performed during the hospital encounter of 04/22/10  MRSA PCR Screening     Status: None   Collection Time: 04/23/10 11:25 AM  Result Value Ref Range Status   MRSA by PCR  NEGATIVE Final    NEGATIVE        The GeneXpert MRSA Assay (FDA approved for NASAL specimens only), is one component of a comprehensive MRSA colonization surveillance program. It is not intended to diagnose MRSA infection nor to guide or monitor treatment for MRSA infections.    Coagulation Studies: No results for input(s): LABPROT, INR in the last 72 hours.  Urinalysis:  Recent Labs Lab 12/17/14 1444  COLORURINE YELLOW  LABSPEC 1.016  PHURINE 6.0  GLUCOSEU NEGATIVE  HGBUR LARGE*  BILIRUBINUR NEGATIVE  KETONESUR 15*  PROTEINUR 100*  UROBILINOGEN 0.2  NITRITE NEGATIVE  LEUKOCYTESUR NEGATIVE    Lipid Panel:     Component Value Date/Time   CHOL * 04/24/2010 0445    260        ATP III CLASSIFICATION:  <200     mg/dL   Desirable  200-239  mg/dL   Borderline High  >=240    mg/dL   High          TRIG 134 04/24/2010 0445   HDL 56 04/24/2010 0445   CHOLHDL 4.6 04/24/2010 0445   VLDL 27  04/24/2010 0445   LDLCALC * 04/24/2010 0445    177        Total Cholesterol/HDL:CHD Risk Coronary Heart Disease Risk Table                     Men   Women  1/2 Average Risk   3.4   3.3  Average Risk       5.0   4.4  2 X Average Risk   9.6   7.1  3 X Average Risk  23.4   11.0        Use the calculated Patient Ratio above and the CHD Risk Table to determine the patient's CHD Risk.  ATP III CLASSIFICATION (LDL):  <100     mg/dL   Optimal  100-129  mg/dL   Near or Above                    Optimal  130-159  mg/dL   Borderline  160-189  mg/dL   High  >190     mg/dL   Very High    HgbA1C:  Lab Results  Component Value Date   HGBA1C * 04/24/2010    6.0 (NOTE)                                                                       According to the ADA Clinical Practice Recommendations for 2011, when HbA1c is used as a screening test:   >=6.5%   Diagnostic of Diabetes Mellitus           (if abnormal result  is confirmed)  5.7-6.4%   Increased risk of developing Diabetes Mellitus  References:Diagnosis and Classification of Diabetes Mellitus,Diabetes DVVO,1607,37(TGGYI 1):S62-S69 and Standards of Medical Care in         Diabetes - 2011,Diabetes Care,2011,34  (Suppl 1):S11-S61.    Urine Drug Screen:  No results found for: LABOPIA, COCAINSCRNUR, LABBENZ, AMPHETMU, THCU, LABBARB  Alcohol Level: No results for input(s): ETH in the last 168 hours.  Other results:  Imaging: Dg Thoracic Spine W/swimmers  12/17/2014   CLINICAL DATA:  Pain along the base of the left neck.  EXAM: THORACIC SPINE - 2 VIEW + SWIMMERS  COMPARISON:  04/25/2010  FINDINGS: Atherosclerotic calcification of the aortic arch. Minimal thoracic spondylosis. No thoracic spine compression or malalignment. Appearance of the lung parenchyma suggests emphysema.  IMPRESSION: 1. Emphysema. 2. Atherosclerotic aortic arch. 3. Mild thoracic spondylosis. No thoracic malalignment or compression identified.   Electronically Signed    By: Van Clines M.D.   On: 12/17/2014 21:39   Ct Head Wo Contrast  12/17/2014   CLINICAL DATA:  Fall.  EXAM: CT HEAD WITHOUT CONTRAST  CT CERVICAL SPINE WITHOUT CONTRAST  TECHNIQUE: Multidetector CT imaging of the head and cervical spine was performed following the standard protocol without intravenous contrast. Multiplanar CT image reconstructions of the cervical spine were also generated.  COMPARISON:  08/16/2014 head CT.  FINDINGS: CT HEAD FINDINGS  There is atrophy and chronic small vessel disease changes. Old bilateral thalamic lacunar infarcts. No acute intracranial abnormality. Specifically, no hemorrhage, hydrocephalus, mass lesion, acute infarction, or significant intracranial injury. No acute calvarial abnormality. Visualized paranasal sinuses and mastoids clear. Orbital soft tissues unremarkable.  CT CERVICAL SPINE FINDINGS  Normal alignment. Degenerative disc disease changes diffusely with disc space narrowing and spurring. Prevertebral soft tissues are normal. No fracture. No epidural or paraspinal hematoma. Dense carotid vascular calcifications bilaterally. Small calcified and noncalcified nodules within the thyroid. Scarring noted in the apices of the lungs bilaterally with emphysematous changes.  IMPRESSION: No acute intracranial abnormality. Atrophy, chronic small vessel disease.  No acute bony abnormality in the cervical spine.   Electronically Signed   By: Rolm Baptise M.D.   On: 12/17/2014 16:08   Ct Cervical Spine Wo Contrast  12/17/2014   CLINICAL DATA:  Fall.  EXAM: CT HEAD WITHOUT CONTRAST  CT CERVICAL SPINE WITHOUT CONTRAST  TECHNIQUE: Multidetector CT imaging of the head and cervical spine was performed following the standard protocol without intravenous contrast. Multiplanar CT image reconstructions of the cervical spine were also generated.  COMPARISON:  08/16/2014 head CT.  FINDINGS: CT HEAD FINDINGS  There is atrophy and chronic small vessel disease changes. Old bilateral  thalamic lacunar infarcts. No acute intracranial abnormality. Specifically, no hemorrhage, hydrocephalus, mass lesion, acute infarction, or significant intracranial injury. No acute calvarial abnormality. Visualized paranasal sinuses and mastoids clear. Orbital soft tissues unremarkable.  CT CERVICAL SPINE FINDINGS  Normal alignment. Degenerative disc disease changes diffusely with disc space narrowing and spurring. Prevertebral soft tissues are normal. No fracture. No epidural or paraspinal hematoma. Dense carotid vascular calcifications bilaterally. Small calcified and noncalcified nodules within the thyroid. Scarring noted in the apices of the lungs bilaterally with emphysematous changes.  IMPRESSION: No acute intracranial abnormality. Atrophy, chronic small vessel disease.  No acute bony abnormality in the cervical spine.   Electronically Signed   By: Rolm Baptise M.D.   On: 12/17/2014 16:08   Mr Brain Wo Contrast  12/17/2014   CLINICAL DATA:  Initial evaluation acute syncope with increased right-sided weakness.  EXAM: MRI HEAD WITHOUT CONTRAST  TECHNIQUE: Multiplanar, multiecho pulse sequences of the brain and surrounding structures were obtained without intravenous contrast.  COMPARISON:  Prior CT from earlier the same day.  FINDINGS: Study is moderately degraded by motion artifact.  Diffuse prominence of the CSF containing spaces is compatible with generalized cerebral atrophy. Patchy and confluent T2/FLAIR hyperintensity within the periventricular and deep white matter both cerebral hemispheres most consistent with chronic small vessel ischemic disease. Multiple remote lacunar infarcts involve the bilateral basal ganglia, specifically within the thalami. Additional small remote lacunar infarct present within the periventricular white matter of the right corona radiata.  There is a small acute ischemic infarct involving the periventricular white matter of the left corona radiata (series 9, image 22). This  appears somewhat curvilinear in nature on coronal DWI sequence, measuring approximately 1 cm in length (series 10, image 14). No associated hemorrhage or significant mass effect. No other acute intracranial infarct. Normal intravascular flow voids preserved.  No mass lesion or midline shift. No mass effect. Ventricular prominence related global parenchymal volume loss present without hydrocephalus. No extra-axial fluid collection.  Craniocervical junction within normal limits. Visualized upper cervical spine is unremarkable. Pituitary gland grossly normal.  No acute abnormality about the orbits. Sequelae of prior lens extraction noted bilaterally.  Minimal mucosal thickening present within the right maxillary sinus. Paranasal sinuses are otherwise clear. No mastoid effusion. Inner ear structures normal.  Bone marrow signal intensity within normal limits. Scalp soft tissues unremarkable.  IMPRESSION: 1. Small acute approximately 1 cm curvilinear ischemic infarct involving the periventricular white matter of the left corona radiata. No significant mass effect or associated hemorrhage. 2. No other acute intracranial process. 3. Multiple remote lacunar infarcts involving the bilateral basal ganglia as well as the periventricular white matter of the right corona radiata. 4. Generalized cerebral atrophy with advanced chronic microvascular ischemic disease.   Electronically Signed   By: Jeannine Boga M.D.   On: 12/17/2014 22:51    Assessment: 69 y.o. male with history of HTN, CVA with right sided hemiparesis, cognitive decline presenting with syncopal episode of unclear etiology. MRI brain shows small CVA in left corona radiata. Low suspicion that this represents the cause of his syncope. With history of prior stroke will complete new stroke workup.   Plan: 1. HgbA1c, fasting lipid panel  2. MRA  of the brain without contrast 3. PT consult, OT consult, Speech consult 4. Echocardiogram 5. Carotid  dopplers 6. Prophylactic therapy-increase ASA to '325mg'$  daily 7. Risk factor modification 8. Telemetry monitoring 9. Frequent neuro checks   Jim Like, DO [facility] [phone]  If 7pm- 7am, please page neurology on call as listed in [place]. 12/18/2014, 10:41 AM

## 2014-12-18 NOTE — Progress Notes (Signed)
[facility] Progress Note   Hector Rice XTG:[id number] DOB: May 24, 1946 DOA: 12/17/2014 PCP: Hector Po, FNP  Brief narrative: Hector Rice is a 69 y.o. male with a history of CVA with right-sided hemiparesis, hypertension who was admitted for passing out in his bathroom. He is apparently on the floor for about 8 hours prior to being found by his sister. The patient recalls passing out and does not recall having any neurological or cardiac symptoms prior to the episode. He does state he had one large loose stool earlier that morning. He did not have any vomiting abdominal pain or fevers. He has not had any stools since that episode. He was brought into the ER and was found to have a sodium of 121-baseline is 127. He was also found to have rhabdomyolysis. He was admitted for treatment and further workup of syncope.   Subjective: Hector Rice was examined this morning. He is laying in bed and is without complaints. Specifically, he has not had any further diarrhea since being in the hospital. He has not had any nausea vomiting or abdominal pain and is tolerating a solid diet. No complaints of dizziness, palpitations or shortness of breath. No new neurological symptoms including new weakness, visual disturbance or new numbness.  Assessment/Plan: Principal Problem:   Syncope -Suspected to be related to dehydration, hyponatremia or vasovagal in nature. -Further workup with MRI does reveal an acute CVA-see below -Follow workup with a 2-D echo, carotid Dopplers, EEG and continue to follow on telemetry  Active Problems:   CVA (cerebral infarction) -Acute CVA noted on MRI in the left coronary radiata-he does not appear to have any new neurological complaints  --I have requested a neurology consult - Per neuro recommendations increase 81 mg aspirin to 325 mg aspirin daily -We will order a lipid panel for tomorrow morning -Continue statin -Check hemoglobin A1c  Rhabdomyolysis -The result of  laying on the floor for close to 8 hours-continue to hydrate aggressively and follow    Essential hypertension -Continue amlodipine    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 -He had one episode yesterday and has not had any further-will DC enteric precautions-  no further workup needed to see develops further diarrhea during the hospital stay    Code Status: DO NOT RESUSCITATE Family Communication:  Disposition Plan: Home with home health PT/OT, follow-up on 2-D echo, carotid Doppler and stroke workup DVT prophylaxis: Enoxaparin Consultants: Neurology Procedures:  Antibiotics: Anti-infectives    None      Objective: [facility] Weights   12/17/14 1413 12/17/14 1839  Weight: 56.7 kg (125 lb) 55.384 kg (122 lb 1.6 oz)    Intake/Output Summary (Last 24 hours) at 12/18/14 1049 Last data filed at 12/18/14 0840  Gross per 24 hour  Intake    240 ml  Output    300 ml  Net    -60 ml     Vitals Filed Vitals:   12/17/14 1839 12/17/14 2216 12/18/14 0606 12/18/14 0729  BP: 139/72 139/67 134/70 136/70  Pulse: 103 90 80 84  Temp: 97.8 F (36.6 C) 98.2 F (36.8 C) 98 F (36.7 C) 98.5 F (36.9 C)  TempSrc: Oral Oral Oral Oral  Resp: '20 18 18 18  '$ Height: '5\' 9"'$  (1.753 m)     Weight: 55.384 kg (122 lb 1.6 oz)  SpO2:  94% 93% 97%    Exam:  General:  Pt is alert, not in acute distress  HEENT: No icterus, No thrush  Cardiovascular: regular rate and rhythm, S1/S2 No murmur  Respiratory: clear to auscultation bilaterally   Abdomen: Soft, +Bowel sounds, non tender, non distended, no guarding  MSK: No LE edema, cyanosis or clubbing- right arm is contracted  Data Reviewed: Basic Metabolic Panel:  Recent Labs Lab 12/17/14 1520 12/18/14 0635  NA 121* 126*  K 3.6 4.7  CL 83* 95*  CO2 24 26  GLUCOSE  94 82  BUN 6 7  CREATININE 1.01 1.07  CALCIUM 8.5* 8.1*   Liver Function Tests:  Recent Labs Lab 12/17/14 1520 12/18/14 0635  AST 166* 97*  ALT 40 30  ALKPHOS 96 69  BILITOT 1.0 0.7  PROT 7.7 5.9*  ALBUMIN 4.2 3.1*   No results for input(s): LIPASE, AMYLASE in the last 168 hours. No results for input(s): AMMONIA in the last 168 hours. CBC:  Recent Labs Lab 12/17/14 1520 12/18/14 0635  WBC 12.4* 8.6  HGB 15.7 13.2  HCT 43.0 37.0*  MCV 83.8 84.7  PLT 304 241   Cardiac Enzymes:  Recent Labs Lab 12/17/14 1531 12/17/14 1855 12/18/14 0012 12/18/14 0635  CKTOTAL 6448* 3517* 2722* 1938*  CKMB  --  61.4* 40.4* 27.5*  TROPONINI  --  <0.03 <0.03 <0.03   BNP (last 3 results) No results for input(s): BNP in the last 8760 hours.  ProBNP (last 3 results) No results for input(s): PROBNP in the last 8760 hours.  CBG: No results for input(s): GLUCAP in the last 168 hours.  No results found for this or any previous visit (from the past 240 hour(s)).   Studies: Dg Thoracic Spine W/swimmers  12/17/2014   CLINICAL DATA:  Pain along the base of the left neck.  EXAM: THORACIC SPINE - 2 VIEW + SWIMMERS  COMPARISON:  04/25/2010  FINDINGS: Atherosclerotic calcification of the aortic arch. Minimal thoracic spondylosis. No thoracic spine compression or malalignment. Appearance of the lung parenchyma suggests emphysema.  IMPRESSION: 1. Emphysema. 2. Atherosclerotic aortic arch. 3. Mild thoracic spondylosis. No thoracic malalignment or compression identified.   Electronically Signed   By: Van Clines M.D.   On: 12/17/2014 21:39   Ct Head Wo Contrast  12/17/2014   CLINICAL DATA:  Fall.  EXAM: CT HEAD WITHOUT CONTRAST  CT CERVICAL SPINE WITHOUT CONTRAST  TECHNIQUE: Multidetector CT imaging of the head and cervical spine was performed following the standard protocol without intravenous contrast. Multiplanar CT image reconstructions of the cervical spine were also generated.   COMPARISON:  08/16/2014 head CT.  FINDINGS: CT HEAD FINDINGS  There is atrophy and chronic small vessel disease changes. Old bilateral thalamic lacunar infarcts. No acute intracranial abnormality. Specifically, no hemorrhage, hydrocephalus, mass lesion, acute infarction, or significant intracranial injury. No acute calvarial abnormality. Visualized paranasal sinuses and mastoids clear. Orbital soft tissues unremarkable.  CT CERVICAL SPINE FINDINGS  Normal alignment. Degenerative disc disease changes diffusely with disc space narrowing and spurring. Prevertebral soft tissues are normal. No fracture. No epidural or paraspinal hematoma. Dense carotid vascular calcifications bilaterally. Small calcified and noncalcified nodules within the thyroid. Scarring noted in the apices of the lungs bilaterally with emphysematous changes.  IMPRESSION: No acute intracranial abnormality. Atrophy, chronic small vessel disease.  No acute bony abnormality in the cervical spine.   Electronically Signed   By: Rolm Baptise M.D.   On: 12/17/2014 16:08   Ct Cervical Spine  Wo Contrast  12/17/2014   CLINICAL DATA:  Fall.  EXAM: CT HEAD WITHOUT CONTRAST  CT CERVICAL SPINE WITHOUT CONTRAST  TECHNIQUE: Multidetector CT imaging of the head and cervical spine was performed following the standard protocol without intravenous contrast. Multiplanar CT image reconstructions of the cervical spine were also generated.  COMPARISON:  08/16/2014 head CT.  FINDINGS: CT HEAD FINDINGS  There is atrophy and chronic small vessel disease changes. Old bilateral thalamic lacunar infarcts. No acute intracranial abnormality. Specifically, no hemorrhage, hydrocephalus, mass lesion, acute infarction, or significant intracranial injury. No acute calvarial abnormality. Visualized paranasal sinuses and mastoids clear. Orbital soft tissues unremarkable.  CT CERVICAL SPINE FINDINGS  Normal alignment. Degenerative disc disease changes diffusely with disc space narrowing  and spurring. Prevertebral soft tissues are normal. No fracture. No epidural or paraspinal hematoma. Dense carotid vascular calcifications bilaterally. Small calcified and noncalcified nodules within the thyroid. Scarring noted in the apices of the lungs bilaterally with emphysematous changes.  IMPRESSION: No acute intracranial abnormality. Atrophy, chronic small vessel disease.  No acute bony abnormality in the cervical spine.   Electronically Signed   By: Rolm Baptise M.D.   On: 12/17/2014 16:08   Mr Brain Wo Contrast  12/17/2014   CLINICAL DATA:  Initial evaluation acute syncope with increased right-sided weakness.  EXAM: MRI HEAD WITHOUT CONTRAST  TECHNIQUE: Multiplanar, multiecho pulse sequences of the brain and surrounding structures were obtained without intravenous contrast.  COMPARISON:  Prior CT from earlier the same day.  FINDINGS: Study is moderately degraded by motion artifact.  Diffuse prominence of the CSF containing spaces is compatible with generalized cerebral atrophy. Patchy and confluent T2/FLAIR hyperintensity within the periventricular and deep white matter both cerebral hemispheres most consistent with chronic small vessel ischemic disease. Multiple remote lacunar infarcts involve the bilateral basal ganglia, specifically within the thalami. Additional small remote lacunar infarct present within the periventricular white matter of the right corona radiata.  There is a small acute ischemic infarct involving the periventricular white matter of the left corona radiata (series 9, image 22). This appears somewhat curvilinear in nature on coronal DWI sequence, measuring approximately 1 cm in length (series 10, image 14). No associated hemorrhage or significant mass effect. No other acute intracranial infarct. Normal intravascular flow voids preserved.  No mass lesion or midline shift. No mass effect. Ventricular prominence related global parenchymal volume loss present without hydrocephalus. No  extra-axial fluid collection.  Craniocervical junction within normal limits. Visualized upper cervical spine is unremarkable. Pituitary gland grossly normal.  No acute abnormality about the orbits. Sequelae of prior lens extraction noted bilaterally.  Minimal mucosal thickening present within the right maxillary sinus. Paranasal sinuses are otherwise clear. No mastoid effusion. Inner ear structures normal.  Bone marrow signal intensity within normal limits. Scalp soft tissues unremarkable.  IMPRESSION: 1. Small acute approximately 1 cm curvilinear ischemic infarct involving the periventricular white matter of the left corona radiata. No significant mass effect or associated hemorrhage. 2. No other acute intracranial process. 3. Multiple remote lacunar infarcts involving the bilateral basal ganglia as well as the periventricular white matter of the right corona radiata. 4. Generalized cerebral atrophy with advanced chronic microvascular ischemic disease.   Electronically Signed   By: Jeannine Boga M.D.   On: 12/17/2014 22:51    Scheduled Meds:  Scheduled Meds: . amLODipine  10 mg Oral Daily  . aspirin EC  81 mg Oral Daily  . cholecalciferol  1,000 Units Oral Daily  . enoxaparin (LOVENOX) injection  40  mg Subcutaneous Q24H  . folic acid  1 mg Oral Daily  . pravastatin  80 mg Oral Daily  . sodium chloride  3 mL Intravenous Q12H  . thiamine  100 mg Oral Daily   Continuous Infusions: . sodium chloride      Time spent on care of this patient: 35 minutes   [place], MD 12/18/2014, 10:49 AM  LOS: 1 day   [facility] Office  [phone] Pager - Text Page per [email or web address]  If 7PM-7AM, please contact night-coverage [email or web address]

## 2014-12-18 NOTE — Progress Notes (Signed)
EEG completed, results pending. 

## 2014-12-18 NOTE — Progress Notes (Signed)
  Echocardiogram 2D Echocardiogram has been performed.  Donata Clay 12/18/2014, 1:35 PM

## 2014-12-19 ENCOUNTER — Encounter (HOSPITAL_COMMUNITY): Payer: Medicare Other

## 2014-12-19 ENCOUNTER — Inpatient Hospital Stay (HOSPITAL_COMMUNITY): Payer: Medicare Other

## 2014-12-19 DIAGNOSIS — F1010 Alcohol abuse, uncomplicated: Secondary | ICD-10-CM | POA: Insufficient documentation

## 2014-12-19 DIAGNOSIS — I63312 Cerebral infarction due to thrombosis of left middle cerebral artery: Secondary | ICD-10-CM | POA: Diagnosis not present

## 2014-12-19 DIAGNOSIS — W19XXXA Unspecified fall, initial encounter: Secondary | ICD-10-CM | POA: Diagnosis not present

## 2014-12-19 DIAGNOSIS — R160 Hepatomegaly, not elsewhere classified: Secondary | ICD-10-CM

## 2014-12-19 DIAGNOSIS — E785 Hyperlipidemia, unspecified: Secondary | ICD-10-CM | POA: Insufficient documentation

## 2014-12-19 DIAGNOSIS — E43 Unspecified severe protein-calorie malnutrition: Secondary | ICD-10-CM

## 2014-12-19 DIAGNOSIS — E871 Hypo-osmolality and hyponatremia: Secondary | ICD-10-CM | POA: Diagnosis not present

## 2014-12-19 LAB — URINALYSIS, ROUTINE W REFLEX MICROSCOPIC
Bilirubin Urine: NEGATIVE
Glucose, UA: NEGATIVE mg/dL
Hgb urine dipstick: NEGATIVE
KETONES UR: NEGATIVE mg/dL
Nitrite: POSITIVE — AB
PH: 7 (ref 5.0–8.0)
Protein, ur: NEGATIVE mg/dL
SPECIFIC GRAVITY, URINE: 1.009 (ref 1.005–1.030)
UROBILINOGEN UA: 0.2 mg/dL (ref 0.0–1.0)

## 2014-12-19 LAB — BASIC METABOLIC PANEL
Anion gap: 12 (ref 5–15)
BUN: 9 mg/dL (ref 6–20)
CALCIUM: 8.2 mg/dL — AB (ref 8.9–10.3)
CO2: 21 mmol/L — ABNORMAL LOW (ref 22–32)
CREATININE: 0.9 mg/dL (ref 0.61–1.24)
Chloride: 92 mmol/L — ABNORMAL LOW (ref 101–111)
GFR calc Af Amer: >60 mL/min (ref >60)
GFR calc non Af Amer: >60 mL/min (ref >60)
Glucose, Bld: 81 mg/dL (ref 65–99)
Potassium: 3.4 mmol/L — ABNORMAL LOW (ref 3.5–5.1)
SODIUM: 125 mmol/L — AB (ref 135–145)

## 2014-12-19 LAB — URINE MICROSCOPIC-ADD ON

## 2014-12-19 LAB — LIPID PANEL
CHOLESTEROL: 111 mg/dL (ref 0–200)
HDL: 48 mg/dL (ref >40)
LDL Cholesterol: 47 mg/dL (ref 0–99)
Total CHOL/HDL Ratio: 2.3 RATIO
Triglycerides: 82 mg/dL (ref <150)
VLDL: 16 mg/dL (ref 0–40)

## 2014-12-19 LAB — CK: Total CK: 1008 U/L — ABNORMAL HIGH (ref 49–397)

## 2014-12-19 LAB — HEMOGLOBIN A1C
Hgb A1c MFr Bld: 5.5 % (ref 4.8–5.6)
MEAN PLASMA GLUCOSE: 111 mg/dL

## 2014-12-19 MED ORDER — ASPIRIN EC 81 MG PO TBEC
81.0000 mg | DELAYED_RELEASE_TABLET | Freq: Every day | ORAL | Status: DC
  Administered 2014-12-20 – 2014-12-21 (×2): 81 mg via ORAL
  Filled 2014-12-19 (×2): qty 1

## 2014-12-19 MED ORDER — POTASSIUM CHLORIDE CRYS ER 20 MEQ PO TBCR
40.0000 meq | EXTENDED_RELEASE_TABLET | Freq: Once | ORAL | Status: AC
  Administered 2014-12-19: 40 meq via ORAL
  Filled 2014-12-19: qty 2

## 2014-12-19 MED ORDER — CLOPIDOGREL BISULFATE 75 MG PO TABS
75.0000 mg | ORAL_TABLET | Freq: Every day | ORAL | Status: DC
  Administered 2014-12-19 – 2014-12-20 (×2): 75 mg via ORAL
  Filled 2014-12-19 (×2): qty 1

## 2014-12-19 MED ORDER — TUBERCULIN PPD 5 UNIT/0.1ML ID SOLN
5.0000 [IU] | Freq: Once | INTRADERMAL | Status: AC
  Administered 2014-12-19: 5 [IU] via INTRADERMAL
  Filled 2014-12-19: qty 0.1

## 2014-12-19 NOTE — Care Management Note (Addendum)
Case Management Note  Patient Details  Name: Hector Rice MRN: [id number] Date of Birth: [birth year]  Subjective/Objective: Pt admitted for Syncope. Plan for d/c to [facility] once stable.                    Action/Plan: CM did set pt up with [facility] via Arlina Robes for PT/OT services. SOC to begin within 24-48 hours post d/c. CM did provide pt and daughter a list of personal care provider agencies if pt needed additional care.  No further needs from CM at this time.   Expected Discharge Date:                  Expected Discharge Plan:  Assisted Living / Rest Home (Pt and daughter chose IDL Facility. )  In-House Referral:  Clinical Social Work  Discharge planning Services  CM Consult  Post Acute Care Choice:    Choice offered to:     DME Arranged:    DME Agency:     HH Arranged:  PT, OT HH Agency:  Other - See comment ([facility] faxed )  Status of Service:     Medicare Important Message Given:  N/A - LOS <3 / Initial given by admissions Date Medicare IM Given:    Medicare IM give by:    Date Additional Medicare IM Given:    Additional Medicare Important Message give by:     If discussed at [place] of Stay Meetings, dates discussed:    Additional Comments:  Bethena Roys, RN 12/19/2014, 11:44 AM

## 2014-12-19 NOTE — Progress Notes (Addendum)
Physical Therapy Treatment Patient Details Name: Hector Rice MRN: [id number] DOB: [birth year] Today's Date: 12/19/2014    History of Present Illness Patient is a 69 year old male with prior history of CVA with right-sided hemiparesis, hypertension, ? Dementia presented to ED after syncopal episode. MRI revealed small acute infarct involving left corona radiata.     PT Comments    Rx limited to bed due to tachycardia. Pt worked with OT just prior to PT arrival. With stance at bedside, HR increased to 147 with pt reporting not feeling well. RN has placed call to the MD. Pt is now agreeable to [name], which is more appropriate than returning home with HHPT for his level of care needed and poor safety awareness. If [name] not available, recommend SNF. D/C plan updated.    Follow Up Recommendations  24-hour supervision, [name] (HHPT)     Equipment Recommendations  None recommended by PT    Recommendations for Other Services       Precautions / Restrictions Precautions Precautions: Fall Precaution Comments: pt states he falls at least once a week. Restrictions Weight Bearing Restrictions: No    Mobility  Bed Mobility Overal bed mobility: Modified Independent         Sit to supine: Supervision   General bed mobility comments: Pt moved in bed slowly but well.  Transfers Overall transfer level: Needs assistance Equipment used: Rolling walker (2 wheeled) Transfers: Sit to/from Stand Sit to Stand: Min assist;Mod assist         General transfer comment: Pt pulls on walker to get up at home and is doing that here and requires min assist to get onto feet and steady.  Ambulation/Gait                 Stairs            Wheelchair Mobility    Modified Rankin (Stroke Patients Only)       Balance Overall balance assessment: Needs assistance Sitting-balance support: Feet supported Sitting balance-Leahy Scale: Good     Standing balance support: Single extremity  supported;During functional activity Standing balance-Leahy Scale: Poor Standing balance comment: Pt had to have the walker to stand.  Without the walker, feel pt would fall.  Feel pt will be a large fall risk if d/c'd back home alone.                    Cognition Arousal/Alertness: Awake/alert Behavior During Therapy: WFL for tasks assessed/performed Overall Cognitive Status: Within Functional Limits for tasks assessed                      Exercises General Exercises - Lower Extremity Ankle Circles/Pumps: Left;AROM;10 reps Heel Slides: Left;10 reps;AROM Hip ABduction/ADduction: AROM;Left;10 reps Straight Leg Raises: AROM;Left;10 reps Other Exercises Other Exercises: Address isolated joint movements RLE.    General Comments        Pertinent Vitals/Pain Pain Assessment: No/denies pain    Home Living                      Prior Function            PT Goals (current goals can now be found in the care plan section) Acute Rehab PT Goals Patient Stated Goal: not stated PT Goal Formulation: With patient/family Time For Goal Achievement: 01/01/15 Potential to Achieve Goals: Good Progress towards PT goals: Not progressing toward goals - comment (Rx limited to bed due to tachycardia.)  Frequency  Min 3X/week    PT Plan Discharge plan needs to be updated    Co-evaluation             End of Session   Activity Tolerance: Treatment limited secondary to medical complications (Comment) (tachy) Patient left: in bed;with bed alarm set;with call bell/phone within reach     Time: 1015-1031 PT Time Calculation (min) (ACUTE ONLY): 16 min  Charges:  $Therapeutic Exercise: 8-22 mins                    G Codes:      Lorriane Shire 12/19/2014, 10:36 AM

## 2014-12-19 NOTE — Progress Notes (Signed)
UA results called to Dr. Wynelle Cleveland.  Will continue to monitor per MD

## 2014-12-19 NOTE — Progress Notes (Signed)
OT Hector Rice) reported that patient's HR increased to 140s while standing.  I paged Dr. Wynelle Cleveland.  Will await return call and continue to monitor.

## 2014-12-19 NOTE — Progress Notes (Signed)
STROKE TEAM PROGRESS NOTE   SUBJECTIVE (INTERVAL HISTORY) His sister is at the bedside.  Overall he feels his condition is stable. He denies any new right sided symptoms. He has right spastic hemiplegia and is wheelchair bound at home. He lives alone and was found by sister on the floor yesterday. Not sure if he had syncope or passing out event.    OBJECTIVE Temp:  [97.6 F (36.4 C)-99.5 F (37.5 C)] 99.5 F (37.5 C) (05/24 0828) Pulse Rate:  [95-103] 95 (05/24 0828) Cardiac Rhythm:  [-] Normal sinus rhythm;Sinus tachycardia (05/24 0800) Resp:  [14-18] 18 (05/24 0400) BP: (139-154)/(71-76) 147/76 mmHg (05/24 0938) SpO2:  [95 %-100 %] 100 % (05/24 0828) Weight:  [121 lb 11.1 oz (55.2 kg)] 121 lb 11.1 oz (55.2 kg) (05/24 0400)  No results for input(s): GLUCAP in the last 168 hours.  Recent Labs Lab 12/17/14 1520 12/18/14 0635 12/19/14 0342  NA 121* 126* 125*  K 3.6 4.7 3.4*  CL 83* 95* 92*  CO2 24 26 21*  GLUCOSE 94 82 81  BUN '6 7 9  '$ CREATININE 1.01 1.07 0.90  CALCIUM 8.5* 8.1* 8.2*    Recent Labs Lab 12/17/14 1520 12/18/14 0635  AST 166* 97*  ALT 40 30  ALKPHOS 96 69  BILITOT 1.0 0.7  PROT 7.7 5.9*  ALBUMIN 4.2 3.1*    Recent Labs Lab 12/17/14 1520 12/18/14 0635  WBC 12.4* 8.6  HGB 15.7 13.2  HCT 43.0 37.0*  MCV 83.8 84.7  PLT 304 241    Recent Labs Lab 12/17/14 1855 12/18/14 0012 12/18/14 0635 12/18/14 1642 12/19/14 0342  CKTOTAL 3517* 2722* 1938* 1652* 1008*  CKMB 61.4* 40.4* 27.5*  --   --   TROPONINI <0.03 <0.03 <0.03  --   --    No results for input(s): LABPROT, INR in the last 72 hours.  Recent Labs  12/17/14 1444  COLORURINE YELLOW  LABSPEC 1.016  PHURINE 6.0  GLUCOSEU NEGATIVE  HGBUR LARGE*  BILIRUBINUR NEGATIVE  KETONESUR 15*  PROTEINUR 100*  UROBILINOGEN 0.2  NITRITE NEGATIVE  LEUKOCYTESUR NEGATIVE       Component Value Date/Time   CHOL 111 12/19/2014 0342   TRIG 82 12/19/2014 0342   HDL 48 12/19/2014 0342   CHOLHDL  2.3 12/19/2014 0342   VLDL 16 12/19/2014 0342   LDLCALC 47 12/19/2014 0342   Lab Results  Component Value Date   HGBA1C 5.5 12/18/2014   No results found for: LABOPIA, COCAINSCRNUR, LABBENZ, AMPHETMU, THCU, LABBARB  No results for input(s): ETH in the last 168 hours.  I have personally reviewed the radiological images below and agree with the radiology interpretations.  Ct Head and cervical spine Wo Contrast  12/17/2014    IMPRESSION: No acute intracranial abnormality. Atrophy, chronic small vessel disease.  No acute bony abnormality in the cervical spine.    Mri and Mra Head Wo Contrast  12/18/2014   IMPRESSION: Progressive atherosclerotic type changes as detailed above.  The focal bulges of the right internal carotid artery pre cavernous segment may reflect result of atherosclerotic disease with aneurysm a secondary consideration. Overall appearance at this level is similar to prior exam.   Carotid Doppler  Pending  EEG - This EEG is suggestive of right sided cerebral dysfunction. There was no seizure or seizure predisposition recorded on this study  2D Echocardiogram  - Grade 1 diastolic dysfunction, trivial mitral and tricuspid regurgitation, otherwise normal study. EF 65-70%  EKG  normal sinus rhythm. For complete results please  see formal report.  PHYSICAL EXAM  Temp:  [97.6 F (36.4 C)-99.5 F (37.5 C)] 99.5 F (37.5 C) (05/24 0828) Pulse Rate:  [95-103] 95 (05/24 0828) Resp:  [14-18] 18 (05/24 0400) BP: (139-154)/(71-76) 147/76 mmHg (05/24 0938) SpO2:  [95 %-100 %] 100 % (05/24 0828) Weight:  [121 lb 11.1 oz (55.2 kg)] 121 lb 11.1 oz (55.2 kg) (05/24 0400)  General - Well nourished, well developed, in no apparent distress.  Ophthalmologic - fundi not visualized due to small pupils.  Cardiovascular - Regular rate and rhythm.  Mental Status -  Level of arousal and orientation to time, place, and person were intact. Language including expression, naming,  repetition, comprehension was assessed and found intact. Fund of Knowledge was assessed and was intact.  Cranial Nerves II - XII - II - Visual field intact OU. III, IV, VI - Extraocular movements intact. V - Facial sensation intact bilaterally. VII - Facial movement intact bilaterally, but mild right decreased strength with eye closure. VIII - Hearing & vestibular intact bilaterally. X - Palate elevates symmetrically. XI - Chin turning & shoulder shrug intact bilaterally. XII - Tongue protrusion intact.  Motor Strength - The patient's strength was 2+/5 right UE proximal but 1/5 distally, RLE 4-/5 but very ataxic. LUE and LLE 5/5. Bulk was decreased and fasciculations were absent.   Motor Tone - Muscle tone was assessed and it showed significantly increased muscle tone on the RUE and mildly increased RLE.    Reflexes - The patient's reflexes were 3+ on the right UE and LE and he had right positive babinsik.  Sensory - Light touch, temperature/pinprick were assessed and were symmetrical.    Coordination - The patient had significant ataxia at right LE.  Tremor was absent.  Gait and Station - not tested as pt wheelchair bound.   ASSESSMENT/PLAN [name] is a 69 y.o. male with history of previous CVA with right hemiparesis as residue, HTN, alcohol abuse admitted for found down at home with diarrhea. Symptoms stable.    Stroke:  Dominant left corona radiata infarct likely secondary to small vessel disease source  MRI  Left corona radiata infarct  MRA  B/l cavernous ICA stenosis, right VA occluded chronically  Carotid Doppler  pending  2D Echo  unremarkable  LDL 47  HgbA1c pending  lovenox for VTE prophylaxis  Diet Heart Room service appropriate?: Yes; Fluid consistency:: Thin   aspirin 81 mg orally every day prior to admission, now on aspirin 81 mg orally every day and clopidogrel 75 mg orally every day. Recommend dural antiplatelet for 3 months and then plavix alone  due to intracranial stenosis.  Patient counseled to be compliant with his antithrombotic medications  Ongoing aggressive stroke risk factor management  Hypertension  Home meds:   norvasc and lisinopril Permissive hypertension (OK if <220/120) for 24-48 hours post stroke and then gradually normalized within 5-7 days. Currently on norvasc  Stable  Patient counseled to be compliant with his blood pressure medications  Hyperlipidemia  Home meds:  Pravastatin 80   Currently home meds resumed  LDL 47, goal < 70  Continue statin at discharge  Hyponatremia  Na 125  May be related to alcohol use and nutritional  Manage as primary team  Alcohol abuse  On multivitamin, B1 and FA  DT precautious  Ativan PRN  Other Stroke Risk Factors  ETOH use  Hx stroke/TIA  Other Active Problems  Liver masses - abdomen US pending   Other Pertinent History  Lives alone at home but decided to go to facility  Hospital day # 2   Rosalin Hawking, MD PhD Stroke Neurology 12/19/2014 2:52 PM    To contact Stroke Continuity provider, please refer to [email or web address] After hours, contact General Neurology

## 2014-12-19 NOTE — Clinical Social Work Note (Signed)
Clinical Social Work Assessment  Patient Details  Name: Hector Rice MRN: [id number] Date of Birth: September 18, 1945  Date of referral:  12/19/14               Reason for consult:  Facility Placement, Discharge Planning                Permission sought to share information with:  Family Supports Permission granted to share information::  Yes, Verbal Permission Granted  Name::     Hector Rice   Relationship::  Daughter  Contact Information:  [phone]  Housing/Transportation Living arrangements for the past 2 months:  [place] of Information:  Patient, Adult Children Patient Interpreter Needed:  None Criminal Activity/Legal Involvement Pertinent to Current Situation/Hospitalization:  No - Comment as needed Significant Relationships:  Adult Children, Siblings Lives with:  Self Do you feel safe going back to the place where you live?  No Need for family participation in patient care:  Yes (Comment)  Care giving concerns:  Patient family has expressed to hospital staff that patient was living at home alone in [place] conditions.  Patient family has extended their efforts and gotten placement for patient at [facility] independent living with additional services.  Facility apartment available tomorrow and patient daughter plans to assist with the move.  Patient daughter expresses her wishes that patient would like to remain as independent as possible upon discharge.   Social Worker assessment / plan:  Holiday representative met with patient at bedside to offer support and discuss patient needs at discharge.  Patient states that he currently lives home alone and if given the option would like to return home alone.  Patient states that he has had conversation with his daughters about the need for placement - provided CSW verbal permission to contact patient daughters.  Patient verbalized that patient daughter was looking in to [facility], however patient states that his father  passed away in that facility and would only agree to a short term arrangement.  CSW contacted patient daughter over the phone who states that she has made arrangements for [facility] independent living and the apartment will be ready 12/20/2014.  Patient daughter aware of patient concerns and plans to transition patient to a different facility at a later time.  CSW completed requested paperwork for placement and notified CM of the need for home health services at discharge.  Patient will discharge to [facility] as if it were a discharge home - no paperwork needed to provide facility at discharge.  CSW signing off at this time.  Please reconsult if further social work needs arise prior to discharge.  Employment status:  Retired, Disabled (Comment on whether or not currently receiving Disability) Insurance information:  Medicare PT Recommendations:  Home with [place] / Referral to community resources:  Other (Comment Required) (Personal Care Services - CM)  Patient/Family's Response to care:  Patient and patient family agreeable with placement at [facility] independent living.  Patient daughter plans to hire additional staff as needed in patient apartment.  Patient and family aware that patient will need additional services upon discharge.  Patient does verbalize his nervousness of returning home alone.  Patient/Family's Understanding of and Emotional Response to Diagnosis, Current Treatment, and Prognosis:  Patient does not grasp the severity of his current needs at discharge.  Patient family seems to be realistic regarding patient continued needs and possible need for long term placement.  Patient family appreciative of support and concern.  Patient family has been able to initiate process and state that finances are not a concern for patient at this time.  Emotional Assessment Appearance:  Appears older than stated age Attitude/Demeanor/Rapport:  Guarded,  Inconsistent Affect (typically observed):  Accepting, Calm, Apprehensive Orientation:  Oriented to Self, Oriented to Place, Oriented to Situation Alcohol / Substance use:  Alcohol Use Psych involvement (Current and /or in the community):  No (Comment)  Discharge Needs  Concerns to be addressed:  Care Coordination Readmission within the last 30 days:  No Current discharge risk:  Cognitively Impaired, Physical Impairment, Lack of support system Barriers to Discharge:  Continued Medical Work up, Unsafe home situation, Other ([place] arrangements)  Barbette Or, [place]

## 2014-12-19 NOTE — Progress Notes (Addendum)
[facility] Progress Note   Hector Rice KDT:[id number] DOB: November 03, 1945 DOA: 12/17/2014 PCP: Mauricio Po, FNP  Brief narrative: Hector Rice is a 69 y.o. male with a history of CVA with right-sided hemiparesis, hypertension who was admitted for passing out in his bathroom. He is apparently on the floor for about 8 hours prior to being found by his sister. The patient recalls passing out and does not recall having any neurological or cardiac symptoms prior to the episode. He does state he had one large loose stool earlier that morning. He did not have any vomiting abdominal pain or fevers. He has not had any stools since that episode. He was brought into the ER and was found to have a sodium of 121-baseline is 127. He was also found to have rhabdomyolysis. He was admitted for treatment and further workup of syncope.   Subjective: Hector Rice was examined this morning-his daughter was at bedside and I discussed the plan with her and answered all questions. The patient had no complaints of dizziness, shortness of breath, chest pain, nausea, vomiting or diarrhea. According to the daughter he appeared slightly confused to her. She told me that he drinks alcohol on a daily basis.discussed plan for him to go to an independent living facility rather than home and he is in agreement with this plan  Assessment/Plan: Principal Problem:   Syncope -Suspected to be related to dehydration, hyponatremia or vasovagal in nature. -Further workup with MRI does reveal an acute CVA-see below - 2-D echo has been performed and is unrevealingOther than mild diastolic dysfunction-see report below - carotid Dopplers pending - EEG and continue to follow on telemetry  Active Problems:   CVA (cerebral infarction) -Acute CVA noted on MRI in the left coronary radiata-he does not appear to have any new neurological complaints  --I have requested a neurology consult - Per neuro recommendations increase 81 mg aspirin to  325 mg aspirin daily - lipid panel is within normal limits -Continue statin -Check hemoglobin A1c  Rhabdomyolysis -The result of laying on the floor for close to 8 hours -continue to hydrate aggressively and follow- CPK has been improving    Essential hypertension -Continue amlodipine    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 -He had one episode yesterday and has not had any further-will DC enteric precautions-  no further workup needed to see develops further diarrhea during the hospital stay  Abnormal thyroid studies -TSH is 0.860 which is normal-free T4 is mildly elevated at 1.17, per limit of normal being 1.12 -recommend repeating thyroid functions as outpatient  Daily alcohol abuse -No significant withdrawal symptoms noted-according to the daughter he is slightly confused today which I have not noted-continue to follow for withdrawal symptoms    Code Status: DO NOT RESUSCITATE Family Communication:  Disposition Plan:  Independent living facility with home health PT/OT DVT prophylaxis: Enoxaparin Consultants: Neurology Procedures:  2D echo Grade 1 diastolic dysfunction, trivial mitral and tricuspid regurgitation, otherwise normal study.  Antibiotics: Anti-infectives    None      Objective: Filed Weights   12/17/14 1413 12/17/14 1839 12/19/14 0400  Weight: 56.7 kg (125 lb) 55.384 kg (122 lb 1.6 oz) 55.2 kg (121 lb 11.1 oz)    Intake/Output Summary (Last  24 hours) at 12/19/14 1258 Last data filed at 12/19/14 0957  Gross per 24 hour  Intake    240 ml  Output    750 ml  Net   -510 ml     Vitals Filed Vitals:   12/19/14 0000 12/19/14 0400 12/19/14 0828 12/19/14 0938  BP:  144/73  154/72 147/76  Pulse:  103 95   Temp: 98.2 F (36.8 C) 97.6 F (36.4 C) 99.5 F (37.5 C)   TempSrc:  Oral Axillary   Resp:  18    Height:      Weight:  55.2 kg (121 lb 11.1 oz)    SpO2: 98% 97% 100%     Exam:  General:  Pt is alert, not in acute distress  HEENT: No icterus, No thrush  Cardiovascular: regular rate and rhythm, S1/S2 No murmur  Respiratory: clear to auscultation bilaterally   Abdomen: Soft, +Bowel sounds, non tender, non distended, no guarding  MSK: No LE edema, cyanosis or clubbing- right arm is contracted  Data Reviewed: Basic Metabolic Panel:  Recent Labs Lab 12/17/14 1520 12/18/14 0635 12/19/14 0342  NA 121* 126* 125*  K 3.6 4.7 3.4*  CL 83* 95* 92*  CO2 24 26 21*  GLUCOSE 94 82 81  BUN '6 7 9  '$ CREATININE 1.01 1.07 0.90  CALCIUM 8.5* 8.1* 8.2*   Liver Function Tests:  Recent Labs Lab 12/17/14 1520 12/18/14 0635  AST 166* 97*  ALT 40 30  ALKPHOS 96 69  BILITOT 1.0 0.7  PROT 7.7 5.9*  ALBUMIN 4.2 3.1*   No results for input(s): LIPASE, AMYLASE in the last 168 hours. No results for input(s): AMMONIA in the last 168 hours. CBC:  Recent Labs Lab 12/17/14 1520 12/18/14 0635  WBC 12.4* 8.6  HGB 15.7 13.2  HCT 43.0 37.0*  MCV 83.8 84.7  PLT 304 241   Cardiac Enzymes:  Recent Labs Lab 12/17/14 1855 12/18/14 0012 12/18/14 0635 12/18/14 1642 12/19/14 0342  CKTOTAL 3517* 2722* 1938* 1652* 1008*  CKMB 61.4* 40.4* 27.5*  --   --   TROPONINI <0.03 <0.03 <0.03  --   --    BNP (last 3 results) No results for input(s): BNP in the last 8760 hours.  ProBNP (last 3 results) No results for input(s): PROBNP in the last 8760 hours.  CBG: No results for input(s): GLUCAP in the last 168 hours.  Recent Results (from the past 240 hour(s))  Urine culture     Status: None (Preliminary result)   Collection Time: 12/18/14  6:00 AM  Result Value Ref Range Status   Specimen Description URINE, CLEAN CATCH  Final   Special Requests NONE   Final   Colony Count   Final    >=100,000 COLONIES/ML Performed at [facility]    Culture   Final    ESCHERICHIA COLI Performed at [facility]    Report Status PENDING  Incomplete     Studies: Dg Thoracic Spine W/swimmers  12/17/2014   CLINICAL DATA:  Pain along the base of the left neck.  EXAM: THORACIC SPINE - 2 VIEW + SWIMMERS  COMPARISON:  04/25/2010  FINDINGS: Atherosclerotic calcification of the aortic arch. Minimal thoracic spondylosis. No thoracic spine compression or malalignment. Appearance of the lung parenchyma suggests emphysema.  IMPRESSION: 1. Emphysema. 2. Atherosclerotic aortic arch. 3. Mild thoracic spondylosis. No thoracic malalignment or compression identified.   Electronically Signed   By: Van Clines M.D.   On: 12/17/2014 21:39   Ct  Head Wo Contrast  12/17/2014   CLINICAL DATA:  Fall.  EXAM: CT HEAD WITHOUT CONTRAST  CT CERVICAL SPINE WITHOUT CONTRAST  TECHNIQUE: Multidetector CT imaging of the head and cervical spine was performed following the standard protocol without intravenous contrast. Multiplanar CT image reconstructions of the cervical spine were also generated.  COMPARISON:  08/16/2014 head CT.  FINDINGS: CT HEAD FINDINGS  There is atrophy and chronic small vessel disease changes. Old bilateral thalamic lacunar infarcts. No acute intracranial abnormality. Specifically, no hemorrhage, hydrocephalus, mass lesion, acute infarction, or significant intracranial injury. No acute calvarial abnormality. Visualized paranasal sinuses and mastoids clear. Orbital soft tissues unremarkable.  CT CERVICAL SPINE FINDINGS  Normal alignment. Degenerative disc disease changes diffusely with disc space narrowing and spurring. Prevertebral soft tissues are normal. No fracture. No epidural or paraspinal hematoma. Dense carotid vascular calcifications bilaterally. Small calcified and noncalcified nodules within the thyroid. Scarring noted in the apices of the lungs  bilaterally with emphysematous changes.  IMPRESSION: No acute intracranial abnormality. Atrophy, chronic small vessel disease.  No acute bony abnormality in the cervical spine.   Electronically Signed   By: Rolm Baptise M.D.   On: 12/17/2014 16:08   Ct Cervical Spine Wo Contrast  12/17/2014   CLINICAL DATA:  Fall.  EXAM: CT HEAD WITHOUT CONTRAST  CT CERVICAL SPINE WITHOUT CONTRAST  TECHNIQUE: Multidetector CT imaging of the head and cervical spine was performed following the standard protocol without intravenous contrast. Multiplanar CT image reconstructions of the cervical spine were also generated.  COMPARISON:  08/16/2014 head CT.  FINDINGS: CT HEAD FINDINGS  There is atrophy and chronic small vessel disease changes. Old bilateral thalamic lacunar infarcts. No acute intracranial abnormality. Specifically, no hemorrhage, hydrocephalus, mass lesion, acute infarction, or significant intracranial injury. No acute calvarial abnormality. Visualized paranasal sinuses and mastoids clear. Orbital soft tissues unremarkable.  CT CERVICAL SPINE FINDINGS  Normal alignment. Degenerative disc disease changes diffusely with disc space narrowing and spurring. Prevertebral soft tissues are normal. No fracture. No epidural or paraspinal hematoma. Dense carotid vascular calcifications bilaterally. Small calcified and noncalcified nodules within the thyroid. Scarring noted in the apices of the lungs bilaterally with emphysematous changes.  IMPRESSION: No acute intracranial abnormality. Atrophy, chronic small vessel disease.  No acute bony abnormality in the cervical spine.   Electronically Signed   By: Rolm Baptise M.D.   On: 12/17/2014 16:08   Mr Jodene Nam Head Wo Contrast  12/18/2014   CLINICAL DATA:  68 year old hypertensive male with small acute infarct posterior left coronal radiata. Subsequent encounter.  EXAM: MRA HEAD WITHOUT CONTRAST  TECHNIQUE: Angiographic images of the Circle of Willis were obtained using MRA technique  without intravenous contrast.  COMPARISON:  12/17/2014 brain MR. 04/23/2010 MR angiogram circle Willis.  FINDINGS: Right internal carotid artery pre cavernous segment posterior bulges measuring up to 2 mm similar to prior exam. Intervening stenosis. Findings may reflect result of atherosclerotic type changes versus focal aneurysm.  Moderate narrowing junction cavernous/ petrous segment right internal carotid artery  Moderate ectasia cavernous segment right internal carotid artery.  Moderate narrowing at the junction of the pre cavernous cavernous segment left internal carotid artery.  No significant stenosis of either carotid terminus, M1 segment of the middle cerebral artery or A1 segment of the anterior cerebral artery.  Middle cerebral artery mild to slightly moderate branch vessel irregularity bilaterally.  Left vertebral artery is dominant. Moderate narrowing distal right vertebral artery.  Poor delineation of the posterior inferior cerebellar arteries.  Ectatic basilar artery  without significant stenosis.  Fetal type contribution to the right posterior cerebral artery.  Mild narrowing proximal left posterior cerebral artery.  Posterior cerebral artery distal branch vessel mild moderate irregularity bilaterally.  IMPRESSION: Progressive atherosclerotic type changes as detailed above.  The focal bulges of the right internal carotid artery pre cavernous segment may reflect result of atherosclerotic disease with aneurysm a secondary consideration. Overall appearance at this level is similar to prior exam. Please see above for further detail.   Electronically Signed   By: Genia Del M.D.   On: 12/18/2014 16:10   Mr Brain Wo Contrast  12/17/2014   CLINICAL DATA:  Initial evaluation acute syncope with increased right-sided weakness.  EXAM: MRI HEAD WITHOUT CONTRAST  TECHNIQUE: Multiplanar, multiecho pulse sequences of the brain and surrounding structures were obtained without intravenous contrast.  COMPARISON:   Prior CT from earlier the same day.  FINDINGS: Study is moderately degraded by motion artifact.  Diffuse prominence of the CSF containing spaces is compatible with generalized cerebral atrophy. Patchy and confluent T2/FLAIR hyperintensity within the periventricular and deep white matter both cerebral hemispheres most consistent with chronic small vessel ischemic disease. Multiple remote lacunar infarcts involve the bilateral basal ganglia, specifically within the thalami. Additional small remote lacunar infarct present within the periventricular white matter of the right corona radiata.  There is a small acute ischemic infarct involving the periventricular white matter of the left corona radiata (series 9, image 22). This appears somewhat curvilinear in nature on coronal DWI sequence, measuring approximately 1 cm in length (series 10, image 14). No associated hemorrhage or significant mass effect. No other acute intracranial infarct. Normal intravascular flow voids preserved.  No mass lesion or midline shift. No mass effect. Ventricular prominence related global parenchymal volume loss present without hydrocephalus. No extra-axial fluid collection.  Craniocervical junction within normal limits. Visualized upper cervical spine is unremarkable. Pituitary gland grossly normal.  No acute abnormality about the orbits. Sequelae of prior lens extraction noted bilaterally.  Minimal mucosal thickening present within the right maxillary sinus. Paranasal sinuses are otherwise clear. No mastoid effusion. Inner ear structures normal.  Bone marrow signal intensity within normal limits. Scalp soft tissues unremarkable.  IMPRESSION: 1. Small acute approximately 1 cm curvilinear ischemic infarct involving the periventricular white matter of the left corona radiata. No significant mass effect or associated hemorrhage. 2. No other acute intracranial process. 3. Multiple remote lacunar infarcts involving the bilateral basal ganglia as  well as the periventricular white matter of the right corona radiata. 4. Generalized cerebral atrophy with advanced chronic microvascular ischemic disease.   Electronically Signed   By: Jeannine Boga M.D.   On: 12/17/2014 22:51    Scheduled Meds:  Scheduled Meds: . amLODipine  10 mg Oral Daily  . aspirin EC  325 mg Oral Daily  . cholecalciferol  1,000 Units Oral Daily  . enoxaparin (LOVENOX) injection  40 mg Subcutaneous Q24H  . feeding supplement (PRO-STAT SUGAR FREE 64)  30 mL Oral BID  . folic acid  1 mg Oral Daily  . multivitamin with minerals  1 tablet Oral Daily  . pravastatin  80 mg Oral Daily  . sodium chloride  3 mL Intravenous Q12H  . thiamine  100 mg Oral Daily  . tuberculin  5 Units Intradermal Once   Continuous Infusions: . sodium chloride 100 mL/hr at 12/18/14 1734    Time spent on care of this patient: 35 minutes   [place], MD 12/19/2014, 12:58 PM  LOS: 2 days  [facility] Office  [phone] Pager - Text Page per [email or web address]  If 7PM-7AM, please contact night-coverage [email or web address]

## 2014-12-19 NOTE — Progress Notes (Signed)
Occupational Therapy Treatment Patient Details Name: Hector Rice MRN: [id number] DOB: 03/23/1946 Today's Date: 12/19/2014    History of present illness Patient is a 69 year old male with prior history of CVA with right-sided hemiparesis, hypertension, ? Dementia presented to ED after syncopal episode. MRI revealed small acute infarct involving left corona radiata.    OT comments  Pt with very slow progress.  Pt with HR into 140s today just with standing. Pt reported feeling woozing and weak so he was returned to supine.  Pt very unsteady on his feet when he is up and has no functional use of RUE.  Pt could use a splint but stated he most likely would not wear it.  Pt may benefit from a palm protector instead.  Feel this pt will be very unsafe to DC home at this point.  Pt drives, uses, oven and stove and as of now cannot let go of his walker w/o losing his balance.  Pt does not have great reaction time in LEs and feel driving may be an unwise decision as well at this time.  Pt would be perfect for an [name] at this time with HHOT or SNF.   Follow Up Recommendations  Other (comment);Supervision/Assistance - 24 hour;Home health OT ([name])    Equipment Recommendations  3 in 1 bedside comode    Recommendations for Other Services      Precautions / Restrictions Precautions Precautions: Fall Precaution Comments: pt states he falls at least once a week. Restrictions Weight Bearing Restrictions: No       Mobility Bed Mobility Overal bed mobility: Modified Independent             General bed mobility comments: Pt moved in bed slowly but well.  Transfers Overall transfer level: Needs assistance Equipment used: Rolling walker (2 wheeled) Transfers: Sit to/from Stand Sit to Stand: Min assist;Mod assist         General transfer comment: Pt pulls on walker to get up at home and is doing that here and requires min assist to get onto feet and steady.    Balance Overall balance  assessment: Needs assistance Sitting-balance support: Feet supported Sitting balance-Leahy Scale: Good     Standing balance support: Single extremity supported;During functional activity Standing balance-Leahy Scale: Poor Standing balance comment: Pt had to have the walker to stand.  Without the walker, feel pt would fall.  Feel pt will be a large fall risk if d/c'd back home alone.                   ADL Overall ADL's : Needs assistance/impaired Eating/Feeding: Set up;Sitting   Grooming: Set up;Sitting;Oral care               Lower Body Dressing: Minimal assistance;Sit to/from stand Lower Body Dressing Details (indicate cue type and reason): pt could donn socks at EOB w/o assist.  Pt needed assist when standing.  Pt unable to let go of walker and keep balance to manage pulling his pants up.             Functional mobility during ADLs: Minimal assistance;Rolling walker General ADL Comments: Feel pt could use a splint but do not feel pt will use it.  He admitted to not using his last splint very much.  Feel he could benfit from a palm protector to preseverve his skin in his R palm.  Pt is a safety risk to be living at home alone.  Pt states he uses the oven, stove  etc.  Pt unable to keep his balance in standing long enough to even think about cooking or taking things out of the oven.  Therapist feels this pt should not be living at home alone and definitley should not be driving.      Vision Eye Alignment: Within Functional Limits Alignment/Gaze Preference: Within Defined Limits Ocular Range of Motion: Within Functional Limits Tracking/Visual Pursuits: Able to track stimulus in all quads without difficulty         Additional Comments: Pt appeared more consistent with vision today than yesterday.   Perception     Praxis      Cognition   Behavior During Therapy: WFL for tasks assessed/performed Overall Cognitive Status: History of cognitive impairments - at  baseline                       Extremity/Trunk Assessment               Exercises     Shoulder Instructions       General Comments      Pertinent Vitals/ Pain       Pain Assessment: No/denies pain  Home Living                                          Prior Functioning/Environment              Frequency Min 2X/week     Progress Toward Goals  OT Goals(current goals can now be found in the care plan section)  Progress towards OT goals: Progressing toward goals  Acute Rehab OT Goals Patient Stated Goal: not stated OT Goal Formulation: With patient Time For Goal Achievement: 12/25/14 Potential to Achieve Goals: Good ADL Goals Pt Will Perform Lower Body Dressing: with set-up;sit to/from stand;with supervision Pt Will Transfer to Toilet: with supervision;ambulating;regular height toilet;grab bars Pt Will Perform Toileting - Clothing Manipulation and hygiene: with supervision;sit to/from stand Additional ADL Goal #1: Pt will participate in further vision assessment and be in agreement with OT recommendations.  Plan Discharge plan needs to be updated    Co-evaluation                 End of Session Equipment Utilized During Treatment: Gait belt;Rolling walker   Activity Tolerance Treatment limited secondary to medical complications (Comment);Other (comment) (Pt with HR up to 147 during standing.)   Patient Left in bed;with call bell/phone within reach;with bed alarm set   Nurse Communication Mobility status;Other (comment) (high HR)        Time: 8768-1157 OT Time Calculation (min): 35 min  Charges: OT General Charges $OT Visit: 1 Procedure OT Treatments $Self Care/Home Management : 23-37 mins  Glenford Peers 12/19/2014, 10:29 AM  [phone]

## 2014-12-20 ENCOUNTER — Encounter (HOSPITAL_COMMUNITY): Payer: Self-pay

## 2014-12-20 ENCOUNTER — Inpatient Hospital Stay (HOSPITAL_COMMUNITY): Payer: Medicare Other

## 2014-12-20 DIAGNOSIS — F1010 Alcohol abuse, uncomplicated: Secondary | ICD-10-CM | POA: Diagnosis not present

## 2014-12-20 DIAGNOSIS — E871 Hypo-osmolality and hyponatremia: Secondary | ICD-10-CM | POA: Diagnosis not present

## 2014-12-20 DIAGNOSIS — I639 Cerebral infarction, unspecified: Secondary | ICD-10-CM

## 2014-12-20 DIAGNOSIS — R55 Syncope and collapse: Secondary | ICD-10-CM | POA: Diagnosis not present

## 2014-12-20 DIAGNOSIS — G8110 Spastic hemiplegia affecting unspecified side: Secondary | ICD-10-CM | POA: Diagnosis not present

## 2014-12-20 DIAGNOSIS — R740 Nonspecific elevation of levels of transaminase and lactic acid dehydrogenase [LDH]: Secondary | ICD-10-CM

## 2014-12-20 DIAGNOSIS — I63312 Cerebral infarction due to thrombosis of left middle cerebral artery: Secondary | ICD-10-CM | POA: Diagnosis not present

## 2014-12-20 DIAGNOSIS — R160 Hepatomegaly, not elsewhere classified: Secondary | ICD-10-CM

## 2014-12-20 DIAGNOSIS — I10 Essential (primary) hypertension: Secondary | ICD-10-CM | POA: Diagnosis not present

## 2014-12-20 DIAGNOSIS — K869 Disease of pancreas, unspecified: Secondary | ICD-10-CM

## 2014-12-20 DIAGNOSIS — N261 Atrophy of kidney (terminal): Secondary | ICD-10-CM

## 2014-12-20 LAB — CBC
HEMATOCRIT: 36.2 % — AB (ref 39.0–52.0)
Hemoglobin: 12.6 g/dL — ABNORMAL LOW (ref 13.0–17.0)
MCH: 29.6 pg (ref 26.0–34.0)
MCHC: 34.8 g/dL (ref 30.0–36.0)
MCV: 85 fL (ref 78.0–100.0)
Platelets: 251 10*3/uL (ref 150–400)
RBC: 4.26 MIL/uL (ref 4.22–5.81)
RDW: 12.3 % (ref 11.5–15.5)
WBC: 11.2 10*3/uL — AB (ref 4.0–10.5)

## 2014-12-20 LAB — BASIC METABOLIC PANEL
ANION GAP: 8 (ref 5–15)
BUN: 7 mg/dL (ref 6–20)
CO2: 26 mmol/L (ref 22–32)
Calcium: 8.4 mg/dL — ABNORMAL LOW (ref 8.9–10.3)
Chloride: 93 mmol/L — ABNORMAL LOW (ref 101–111)
Creatinine, Ser: 0.79 mg/dL (ref 0.61–1.24)
GFR calc non Af Amer: >60 mL/min (ref >60)
Glucose, Bld: 88 mg/dL (ref 65–99)
POTASSIUM: 3.7 mmol/L (ref 3.5–5.1)
Sodium: 127 mmol/L — ABNORMAL LOW (ref 135–145)

## 2014-12-20 LAB — URINE CULTURE

## 2014-12-20 LAB — CK: Total CK: 483 U/L — ABNORMAL HIGH (ref 49–397)

## 2014-12-20 MED ORDER — IOHEXOL 300 MG/ML  SOLN
100.0000 mL | Freq: Once | INTRAMUSCULAR | Status: AC | PRN
  Administered 2014-12-20: 100 mL via INTRAVENOUS

## 2014-12-20 NOTE — Progress Notes (Addendum)
[facility] Progress Note   Hector Rice URK:[id number] DOB: [birth year] DOA: 12/17/2014 PCP: Mauricio Po, FNP  Brief narrative: Hector Rice is a 69 y.o. male with a history of CVA with right-sided hemiparesis, hypertension who was admitted for passing out in his bathroom. He is apparently on the floor for about 8 hours prior to being found by his sister. The patient recalls passing out and does not recall having any neurological or cardiac symptoms prior to the episode. He does state he had one large loose stool earlier that morning. He did not have any vomiting abdominal pain or fevers. He has not had any stools since that episode. He was brought into the ER and was found to have a sodium of 121-baseline is 127. He was also found to have rhabdomyolysis. He was admitted for treatment and further workup of syncope. Workup revealed an acute CVA. Per neurology this likely did not cause his syncope. Further workup of the CVA revealed liver masses noted incidentally on 2-D echo. Further imaging revealed multiple liver masses along with a pancreatic mass.   Subjective: Hector Rice was examined this morning- discussed with him and his daughter the findings on the ultrasound and the CT of the abdomen. They understand that he will have a GI eval and further workup will be ordered afterwards. The patient has no complaints today, specifically no nausea vomiting, abdominal pain diarrhea chest pain or shortness of breath Plan discussed in detail with him and his daughter at bedside.  Assessment/Plan: Principal Problem:   Syncope -Suspected to be related to dehydration, hyponatremia or vasovagal in nature. -Further workup with MRI does reveal an acute CVA-see below - 2-D echo has been performed and is unrevealingOther than mild diastolic dysfunction-see report below - EEG and continue to follow on telemetry  Active Problems:   CVA (cerebral infarction) -Acute CVA noted on MRI in the left coronary  radiata-he does not appear to have any new neurological complaints  --Neurology consult quested - Per neuro recommendations increase 81 mg aspirin to 325 mg aspirin daily - lipid panel is within normal limits -Continue statin - carotid Dopplers pending - hemoglobin A1c normal at 5.5  Rhabdomyolysis -The result of laying on the floor for close to 8 hours - CPK has been improving with hydration-we'll stop normal saline today  Liver masses and pancreatic neck mass -Incidental finding of liver masses when 2-D echo was done -Further evaluation with an ultrasound of the abdomen was ordered which revealed multiple large hepatic masses and a CT scan was recommended -ET of the abdomen and pelvis performed yesterday reveals 8 liver masses consistent with metastasis the largest is in the left liver measuring 8 cm-there is also a 2 cm hypoenhancing pancreatic neck mass and indeterminant 14 mm right renal nodule -GI consult requested-IR guided biopsy of hepatic metastasis requested -Patient's daughter mentions that he has been losing weight this past year-she is unable to tell how much weight he has lost  Infrarenal aortic aneurysm -Measuring 42 mm-found incidentally on abdominal CT  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 -He had one episode yesterday and has not had any further-will DC  enteric precautions-  no further workup needed to see develops further diarrhea during the hospital stay  Abnormal thyroid studies -TSH is 0.860 which is normal-free T4 is mildly elevated at 1.17, per limit of normal being  1.12 -recommend repeating thyroid functions as outpatient  Daily alcohol abuse/highly elevated LFTs -No significant withdrawal symptoms noted-according to the daughter he is slightly confused today which I have not noted-no cirrhosis noted on imaging    Code Status: DO NOT RESUSCITATE Family Communication: With the patient's daughter Disposition Plan:  Independent living facility with home health PT/OT DVT prophylaxis: Enoxaparin Consultants: Neurology, GI Procedures:  2D echo Grade 1 diastolic dysfunction, trivial mitral and tricuspid regurgitation, otherwise normal study.  Antibiotics: Anti-infectives    None      Objective: Filed Weights   12/17/14 1839 12/19/14 0400 12/20/14 0400  Weight: 55.384 kg (122 lb 1.6 oz) 55.2 kg (121 lb 11.1 oz) 55 kg (121 lb 4.1 oz)    Intake/Output Summary (Last 24 hours) at 12/20/14 1140 Last data filed at 12/20/14 0716  Gross per 24 hour  Intake      0 ml  Output   4255 ml  Net  -4255 ml     Vitals Filed Vitals:   12/19/14 2352 12/19/14 2357 12/20/14 0400 12/20/14 0830  BP: 156/71   139/67  Pulse:    103  Temp:  97.9 F (36.6 C) 98 F (36.7 C) 97.9 F (36.6 C)  TempSrc:  Oral Oral Oral  Resp:      Height:      Weight:   55 kg (121 lb 4.1 oz)   SpO2:    100%    Exam:  General:  Pt is alert, not in acute distress  HEENT: No icterus, No thrush  Cardiovascular: regular rate and rhythm, S1/S2 No murmur  Respiratory: clear to auscultation bilaterally   Abdomen: Soft, +Bowel sounds, non tender, non distended, no guarding  MSK: No LE edema, cyanosis or clubbing- right arm is contracted  Data Reviewed: Basic Metabolic Panel:  Recent Labs Lab 12/17/14 1520 12/18/14 0635 12/19/14 0342 12/20/14 0620  NA 121* 126* 125* 127*  K 3.6 4.7 3.4* 3.7  CL 83* 95* 92* 93*  CO2 24 26 21* 26  GLUCOSE 94 82 81 88  BUN '6 7 9 7  '$ CREATININE 1.01 1.07 0.90 0.79  CALCIUM 8.5* 8.1* 8.2* 8.4*   Liver Function Tests:  Recent  Labs Lab 12/17/14 1520 12/18/14 0635  AST 166* 97*  ALT 40 30  ALKPHOS 96 69  BILITOT 1.0 0.7  PROT 7.7 5.9*  ALBUMIN 4.2 3.1*   No results for input(s): LIPASE, AMYLASE in the last 168 hours. No results for input(s): AMMONIA in the last 168 hours. CBC:  Recent Labs Lab 12/17/14 1520 12/18/14 0635 12/20/14 0620  WBC 12.4* 8.6 11.2*  HGB 15.7 13.2 12.6*  HCT 43.0 37.0* 36.2*  MCV 83.8 84.7 85.0  PLT 304 241 251   Cardiac Enzymes:  Recent Labs Lab 12/17/14 1855 12/18/14 0012 12/18/14 0635 12/18/14 1642 12/19/14 0342 12/20/14 0620  CKTOTAL 3517* 2722* 1938* 1652* 1008* 483*  CKMB 61.4* 40.4* 27.5*  --   --   --   TROPONINI <0.03 <0.03 <0.03  --   --   --    BNP (last 3 results) No results for input(s): BNP in the last 8760 hours.  ProBNP (last 3 results) No results for input(s): PROBNP in the last 8760 hours.  CBG: No results for input(s): GLUCAP in  the last 168 hours.  Recent Results (from the past 240 hour(s))  Urine culture     Status: None   Collection Time: 12/18/14  6:00 AM  Result Value Ref Range Status   Specimen Description URINE, CLEAN CATCH  Final   Special Requests NONE  Final   Colony Count   Final    >=100,000 COLONIES/ML Performed at [facility]    Culture   Final    ESCHERICHIA COLI Performed at [facility]    Report Status 12/20/2014 FINAL  Final   Organism ID, Bacteria ESCHERICHIA COLI  Final      Susceptibility   Escherichia coli - MIC*    AMPICILLIN >=32 RESISTANT Resistant     CEFAZOLIN <=4 SENSITIVE Sensitive     CEFTRIAXONE <=1 SENSITIVE Sensitive     CIPROFLOXACIN <=0.25 SENSITIVE Sensitive     GENTAMICIN <=1 SENSITIVE Sensitive     LEVOFLOXACIN <=0.12 SENSITIVE Sensitive     NITROFURANTOIN <=16 SENSITIVE Sensitive     TOBRAMYCIN <=1 SENSITIVE Sensitive     TRIMETH/SULFA <=20 SENSITIVE Sensitive     PIP/TAZO <=4 SENSITIVE Sensitive     * ESCHERICHIA COLI     Studies: Mr Virgel Paling Wo  Contrast  12/18/2014   CLINICAL DATA:  69 year old hypertensive male with small acute infarct posterior left coronal radiata. Subsequent encounter.  EXAM: MRA HEAD WITHOUT CONTRAST  TECHNIQUE: Angiographic images of the Circle of Willis were obtained using MRA technique without intravenous contrast.  COMPARISON:  12/17/2014 brain MR. 04/23/2010 MR angiogram circle Willis.  FINDINGS: Right internal carotid artery pre cavernous segment posterior bulges measuring up to 2 mm similar to prior exam. Intervening stenosis. Findings may reflect result of atherosclerotic type changes versus focal aneurysm.  Moderate narrowing junction cavernous/ petrous segment right internal carotid artery  Moderate ectasia cavernous segment right internal carotid artery.  Moderate narrowing at the junction of the pre cavernous cavernous segment left internal carotid artery.  No significant stenosis of either carotid terminus, M1 segment of the middle cerebral artery or A1 segment of the anterior cerebral artery.  Middle cerebral artery mild to slightly moderate branch vessel irregularity bilaterally.  Left vertebral artery is dominant. Moderate narrowing distal right vertebral artery.  Poor delineation of the posterior inferior cerebellar arteries.  Ectatic basilar artery without significant stenosis.  Fetal type contribution to the right posterior cerebral artery.  Mild narrowing proximal left posterior cerebral artery.  Posterior cerebral artery distal branch vessel mild moderate irregularity bilaterally.  IMPRESSION: Progressive atherosclerotic type changes as detailed above.  The focal bulges of the right internal carotid artery pre cavernous segment may reflect result of atherosclerotic disease with aneurysm a secondary consideration. Overall appearance at this level is similar to prior exam. Please see above for further detail.   Electronically Signed   By: Genia Del M.D.   On: 12/18/2014 16:10   US Abdomen Limited  12/19/2014    CLINICAL DATA:  Patient with liver mass identified on prior echocardiography.  EXAM: US ABDOMEN LIMITED - RIGHT UPPER QUADRANT  COMPARISON:  None.  FINDINGS: Gallbladder:  The gallbladder is contracted. No gallbladder wall thickening and pericholecystic fluid. Negative sonographic Murphy sign.  Common bile duct:  Diameter: 4 mm  Liver:  Multiple isoechoic hepatic masses are demonstrated including a 6.6 x 5.5 x 7.8 cm mass and a 5.3 x 5.0 x 5.1 cm mass.  IMPRESSION: Multiple large hepatic masses which are indeterminate on ultrasound evaluation. Patient needs further evaluation with pre and post  contrast-enhanced CT or MRI for definitive characterization.  Decompressed gallbladder.  These results will be called to the ordering clinician or representative by the Radiologist Assistant, and communication documented in the PACS or zVision Dashboard.   Electronically Signed   By: Lovey Newcomer M.D.   On: 12/19/2014 16:37   Ct Abd Wo & W Cm  12/20/2014   CLINICAL DATA:  Liver masses found on ultrasound.  EXAM: CT ABDOMEN WITHOUT AND WITH CONTRAST  TECHNIQUE: Multidetector CT imaging of the abdomen was performed following the standard protocol before and following the bolus administration of intravenous contrast.  CONTRAST:  173m OMNIPAQUE IOHEXOL 300 MG/ML  SOLN  COMPARISON:  Liver ultrasound 12/19/2014  FINDINGS: BODY WALL: No contributory findings.  LOWER CHEST: No contributory findings.  ABDOMEN/PELVIS:  Liver: There are 8 hypo-enhancing masses randomly distributed throughout the liver, the largest spanning segments 2 and 3 at 8 cm. The larger have a roughly targetoid enhancement pattern. There is no indication of cirrhosis. Sub cm nonenhancing cyst noted in the right liver ventral to the inferior portal vein.  Biliary: No evidence of biliary obstruction or stone.  Pancreas: There is a hypoenhancing 2 cm mass in the ventral neck of the pancreas. No effect on the main duct. No peripancreatic adenopathy or fat  infiltration.  Spleen: Unremarkable.  Adrenals: 14 mm nodule in the right adrenal gland which does not meet criteria for adenoma based on precontrast imaging. Although there are multiple phases, no 15 minute delay for definitive washout characterization.  Kidneys and ureters: Severe smooth atrophy of the right kidney, likely from high-grade renal artery stenosis.  Bowel: No obstruction or inflammation of the visualized portions.  Peritoneum: No ascites or pneumoperitoneum.  Vascular: Extensive atherosclerosis of the aorta and branch vessels. Bulky calcified atheroma at the level of the renal arteries causes moderate luminal stenosis. There is a partly visible fusiform aortic aneurysm, infrarenal, measuring up to 42 mm and containing peripheral thrombus.  OSSEOUS: No acute abnormalities.  IMPRESSION: 1. 8 liver masses are consistent with metastases, the largest in the left liver measuring 8 cm. A 2 cm hypoenhancing pancreatic neck mass is noted and could reflect a primary pancreatic adenocarcinoma. 2. Indeterminate 14 mm right adrenal nodule. 3. 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 Atherosclerosis is extensive and chronic right renal artery stenosis accounts for severe right renal atrophy.   Electronically Signed   By: JMonte FantasiaM.D.   On: 12/20/2014 06:37    Scheduled Meds:  Scheduled Meds: . amLODipine  10 mg Oral Daily  . aspirin EC  81 mg Oral Daily  . cholecalciferol  1,000 Units Oral Daily  . clopidogrel  75 mg Oral Daily  . enoxaparin (LOVENOX) injection  40 mg Subcutaneous Q24H  . feeding supplement (PRO-STAT SUGAR FREE 64)  30 mL Oral BID  . folic acid  1 mg Oral Daily  . multivitamin with minerals  1 tablet Oral Daily  . pravastatin  80 mg Oral Daily  . sodium chloride  3 mL Intravenous Q12H  . thiamine  100 mg Oral Daily  . tuberculin  5 Units Intradermal Once   Continuous Infusions: . sodium chloride 100 mL/hr at 12/19/14 1538    Time spent on care of  this patient: 35 minutes   REnlow MD 12/20/2014, 11:40 AM  LOS: 3 days   [facility] Office  [phone] - Text Page per [email or web address]  If 7PM-7AM, please contact night-coverage [email or web address]

## 2014-12-20 NOTE — Progress Notes (Signed)
STROKE TEAM PROGRESS NOTE   SUBJECTIVE (INTERVAL HISTORY) His daughter is at the bedside. No acute issue overnight. He has had abdominal imaging with liver masses and pancreatic mass. GI arrived during rounds for consult.    OBJECTIVE Temp:  [97.9 F (36.6 C)-98.4 F (36.9 C)] 97.9 F (36.6 C) (05/25 0830) Pulse Rate:  [103-107] 103 (05/25 0830) Cardiac Rhythm:  [-] Normal sinus rhythm (05/25 0852) BP: (139-156)/(67-84) 139/67 mmHg (05/25 0830) SpO2:  [95 %-100 %] 100 % (05/25 0830) Weight:  [55 kg (121 lb 4.1 oz)] 55 kg (121 lb 4.1 oz) (05/25 0400)   Recent Labs Lab 12/17/14 1520 12/18/14 0635 12/19/14 0342 12/20/14 0620  NA 121* 126* 125* 127*  K 3.6 4.7 3.4* 3.7  CL 83* 95* 92* 93*  CO2 24 26 21* 26  GLUCOSE 94 82 81 88  BUN '6 7 9 7  '$ CREATININE 1.01 1.07 0.90 0.79  CALCIUM 8.5* 8.1* 8.2* 8.4*    Recent Labs Lab 12/17/14 1520 12/18/14 0635  AST 166* 97*  ALT 40 30  ALKPHOS 96 69  BILITOT 1.0 0.7  PROT 7.7 5.9*  ALBUMIN 4.2 3.1*    Recent Labs Lab 12/17/14 1520 12/18/14 0635 12/20/14 0620  WBC 12.4* 8.6 11.2*  HGB 15.7 13.2 12.6*  HCT 43.0 37.0* 36.2*  MCV 83.8 84.7 85.0  PLT 304 241 251    Recent Labs Lab 12/17/14 1855 12/18/14 0012 12/18/14 0635 12/18/14 1642 12/19/14 0342 12/20/14 0620  CKTOTAL 3517* 2722* 1938* 1652* 1008* 483*  CKMB 61.4* 40.4* 27.5*  --   --   --   TROPONINI <0.03 <0.03 <0.03  --   --   --     Recent Labs  12/17/14 1444 12/19/14 1453  COLORURINE YELLOW YELLOW  LABSPEC 1.016 1.009  PHURINE 6.0 7.0  GLUCOSEU NEGATIVE NEGATIVE  HGBUR LARGE* NEGATIVE  BILIRUBINUR NEGATIVE NEGATIVE  KETONESUR 15* NEGATIVE  PROTEINUR 100* NEGATIVE  UROBILINOGEN 0.2 0.2  NITRITE NEGATIVE POSITIVE*  LEUKOCYTESUR NEGATIVE SMALL*       Component Value Date/Time   CHOL 111 12/19/2014 0342   TRIG 82 12/19/2014 0342   HDL 48 12/19/2014 0342   CHOLHDL 2.3 12/19/2014 0342   VLDL 16 12/19/2014 0342   LDLCALC 47 12/19/2014 0342    Lab Results  Component Value Date   HGBA1C 5.5 12/18/2014    Ct Head and cervical spine Wo Contrast 12/17/2014    IMPRESSION: No acute intracranial abnormality. Atrophy, chronic small vessel disease.  No acute bony abnormality in the cervical spine.    Mri and Mra Head Wo Contrast 12/18/2014   IMPRESSION: Progressive atherosclerotic type changes as detailed above.  The focal bulges of the right internal carotid artery pre cavernous segment may reflect result of atherosclerotic disease with aneurysm a secondary consideration. Overall appearance at this level is similar to prior exam.   EEG - This EEG is suggestive of right sided cerebral dysfunction. There was no seizure or seizure predisposition recorded on this study  2D Echocardiogram  - Grade 1 diastolic dysfunction, trivial mitral and tricuspidregurgitation, otherwise normal study. EF 65-70%  EKG  normal sinus rhythm. For complete results please see formal report.  US abdomen - Multiple large hepatic masses which are indeterminate on ultrasound evaluation. Patient needs further evaluation with pre and post contrast-enhanced CT or MRI for definitive characterization.  CT abdomen - 1. 8 liver masses are consistent with metastases, the largest in the left liver measuring 8 cm. A 2 cm hypoenhancing pancreatic neck mass  is noted and could reflect a primary pancreatic adenocarcinoma. 2. Indeterminate 14 mm right adrenal nodule. 3. 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 Atherosclerosis is extensive and chronic right renal artery stenosis accounts for severe right renal atrophy.  LE venous doppler - Preliminary findings: Negative for DVT. Incidental findings = 50-99% stenosis of the right CFA, 50-99% stenosis of the right PFA, and occlusion of the right proximal and mid femoral artery with recanalized flow to distal FA.  50-99% stenosis of the left PFA and 50-99% stenosis of the left femoral artery. Monophasic  flow in bilateral PTA.  CUS - Right = 1-39 % ICA stenosis. Right ECA stenosis noted. Left = 40-59% ICA stenosis  PHYSICAL EXAM  Temp:  [97.9 F (36.6 C)-98.4 F (36.9 C)] 97.9 F (36.6 C) (05/25 0830) Pulse Rate:  [103-107] 103 (05/25 0830) BP: (139-156)/(67-84) 139/67 mmHg (05/25 0830) SpO2:  [95 %-100 %] 100 % (05/25 0830) Weight:  [55 kg (121 lb 4.1 oz)] 55 kg (121 lb 4.1 oz) (05/25 0400)  General - Well nourished, well developed, in no apparent distress.  Ophthalmologic - fundi not visualized due to small pupils.  Cardiovascular - Regular rate and rhythm.  Mental Status -  Level of arousal and orientation to time, place, and person were intact. Language including expression, naming, repetition, comprehension was assessed and found intact. Fund of Knowledge was assessed and was intact.  Cranial Nerves II - XII - II - Visual field intact OU. III, IV, VI - Extraocular movements intact. V - Facial sensation intact bilaterally. VII - Facial movement intact bilaterally, but mild right decreased strength with eye closure. VIII - Hearing & vestibular intact bilaterally. X - Palate elevates symmetrically. XI - Chin turning & shoulder shrug intact bilaterally. XII - Tongue protrusion intact.  Motor Strength - The patient's strength was 2+/5 right UE proximal but 1/5 distally, RLE 4-/5 but very ataxic. LUE and LLE 5/5. Bulk was decreased and fasciculations were absent.   Motor Tone - Muscle tone was assessed and it showed significantly increased muscle tone on the RUE and mildly increased RLE.    Reflexes - The patient's reflexes were 3+ on the right UE and LE and he had right positive babinsik.  Sensory - Light touch, temperature/pinprick were assessed and were symmetrical.    Coordination - The patient had significant ataxia at right LE.  Tremor was absent.  Gait and Station - not tested as pt wheelchair bound.   ASSESSMENT/PLAN Mr. Brison Fiumara is a 69 y.o. male with  history of previous CVA with right hemiparesis as residue, HTN, alcohol abuse admitted for found down at home with diarrhea. Symptoms stable.    Stroke:  Dominant left corona radiata infarct likely secondary to small vessel disease due to multiple stroke risk factors and diffuse atherosclerotic changes vs hypercoagulability from newly diagnosed advanced malignancy  MRI  Left corona radiata infarct  MRA  B/l cavernous ICA stenosis, right VA occluded chronically  Carotid Doppler left 40-59% ICA stenosis  2D Echo  Unremarkable  LE venous doppler no DVT, but severe peripheral vascular disease  LDL 47  HgbA1c 5.5  lovenox for VTE prophylaxis  Diet Heart Room service appropriate?: Yes; Fluid consistency:: Thin   aspirin 81 mg orally every day prior to admission, now on aspirin 81 mg orally every day and clopidogrel 75 mg orally every day. Recommend dural antiplatelet for 3 months and then plavix alone due to intracranial stenosis. Currently, Plavix on hold due to liver biopsy.  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 However, current MRI did not show embolic pattern.   Depends on the goal of care, if patient and family would like to be more aggressive treatment, anticoagulation may be considered for hypercoagulable state.   Hypertension  Home meds:   norvasc and lisinopril  Permissive hypertension (OK if <220/120) for 24-48 hours post stroke and then gradually normalized within 5-7 days.  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 CPK improving  Other Pertinent History  Hospital day # [place] for Pager information 12/20/2014 1:59 PM   I, the attending vascular neurologist, have personally obtained a history, examined the patient, evaluated laboratory data, individually viewed imaging studies and agree with radiology interpretations. I obtained additional history from pt's daughter at bedside. Together with the NP/PA, we formulated the assessment and plan of care which reflects our mutual decision.  I have made any additions or clarifications directly to the above note and agree with the findings and plan as currently documented.   68 year old male with history of previous CVA with residual right-sided hemi-plegia, hypertension, alcohol abuse, was admitted for left corona radiata stroke. Workup showed advanced malignancy with metastasis. His stroke etiology most likely due to small vessel disease given multiple stroke risk factors and diffuse atherosclerosis in the vascular system. However, hypercoagulable state due to advanced malignancy cannot be ruled out. If patient and family would like more aggressive treatment, anticoagulation with Coumadin can be considered. Otherwise, recommend dural and a platelet for 3 months and then Plavix alone. Currently Plavix on hold for liver biopsy. Continue statin and stroke risk factor modification.  Neurology will sign off. Please call with questions. Pt will follow up with Dr. Erlinda Hong at [facility] in about 2 months. Thanks for the consult.  Rosalin Hawking, MD PhD Stroke Neurology 12/20/2014 4:01 PM   To contact Stroke Continuity provider, please refer to [email or web address] After hours, contact General Neurology

## 2014-12-20 NOTE — Progress Notes (Signed)
*  PRELIMINARY RESULTS* Vascular Ultrasound Carotid Duplex has been completed.  Preliminary findings: Right = 1-39 % ICA stenosis. Right ECA stenosis noted. Left = 40-59% ICA stenosis.  Landry Mellow, RDMS, RVT  12/20/2014, 3:24 PM

## 2014-12-20 NOTE — Progress Notes (Signed)
*  PRELIMINARY RESULTS* Vascular Ultrasound Lower extremity venous duplex has been completed.  Preliminary findings: Negative for DVT.  Incidental findings = 50-99% stenosis of the right CFA, 50-99% stenosis of the right PFA, and occlusion of the right proximal and mid femoral artery with recanalized flow to distal FA.  50-99% stenosis of the left PFA and 50-99% stenosis of the left femoral artery. Monophasic flow in bilateral PTA.   Landry Mellow, RDMS, RVT  12/20/2014, 3:34 PM

## 2014-12-20 NOTE — Consult Note (Signed)
[facility] Consult: 8:15 AM 12/20/2014  LOS: 3 days    Referring Provider: Wynelle Cleveland  Primary Care Physician:  Mauricio Po, [place] Primary Gastroenterologist:  None    Reason for Consultation:  Liver masses, pancreatic mass. Interviewed with his dtr present.     HPI: Hector Rice is a 69 y.o. male.  Hx 2011 CVA, residual right hemiparesis. Wheelchair bound but can self transfer to bed and commode. Dementia. Chronic hyponatremia.  Alcoholic, drinks 6 beers daily and used to drink more.   Admitted after he was found down at home.  Had had watery diarrhea, ? Syncope.  CK of 6448. NA 121. AST to 166, o/w normal LFTs. Acute CVA on head CT.  2 liver masses seen on cardiac 2d echo.   Abd [place]: multiple large liver masses.  CT abdomen: 8 liver masses, largest 8 cm.  2 cm mass in pancreatic neck.  Indeterminate right adrenal nodule. 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   GI wise: no hx of EGD or colonoscopy.  For 2 months stool pattern has been long spells of up to 5 days without BMs, followed by 24 to 36 hours of several stools, sometimes watery but not melenic or bloody.  No abdominal pain.  + years of anorexia and weight loss but both accelerated in several weeks.  No odynophagia.  Poor dentition but no problems chewing or swallowing.  No Fm Hx of colon or digestive system cancers.  No hx liver, pancreas problems.  Sees PMD quarterly.     Past Medical History  Diagnosis Date  . Hypertension   . Memory loss   . Stroke     Right sided hemiparesis    Past Surgical History  Procedure Laterality Date  . Hand surgery      Prior to Admission medications   Medication Sig Start Date End Date Taking? Authorizing Provider  amLODipine (NORVASC) 10 MG tablet Take 1 tablet (10 mg total) by mouth daily. 11/14/14  Yes Golden Circle,  FNP  aspirin EC 81 MG tablet Take 81 mg by mouth daily.   Yes Historical Provider, MD  baclofen (LIORESAL) 10 MG tablet Take 1 tablet (10 mg total) by mouth 4 (four) times daily. Patient taking differently: Take 10 mg by mouth daily as needed for muscle spasms.  09/27/14  Yes Pieter Partridge, DO  Cholecalciferol (VITAMIN D3 PO) Take 1 tablet by mouth daily.   Yes Historical Provider, MD  FOLIC ACID PO Take 1 tablet by mouth daily.   Yes Historical Provider, MD  lisinopril (PRINIVIL,ZESTRIL) 40 MG tablet Take 1 tablet (40 mg total) by mouth every morning. 11/14/14  Yes Golden Circle, FNP  pravastatin (PRAVACHOL) 80 MG tablet Take 1 tablet (80 mg total) by mouth daily. 11/14/14  Yes Golden Circle, FNP  Thiamine HCl (VITAMIN B-1 PO) Take 1 tablet by mouth daily.   Yes Historical Provider, MD    Scheduled Meds: . amLODipine  10 mg Oral Daily  . aspirin EC  81 mg Oral Daily  . cholecalciferol  1,000 Units Oral Daily  .  clopidogrel  75 mg Oral Daily  . enoxaparin (LOVENOX) injection  40 mg Subcutaneous Q24H  . feeding supplement (PRO-STAT SUGAR FREE 64)  30 mL Oral BID  . folic acid  1 mg Oral Daily  . multivitamin with minerals  1 tablet Oral Daily  . pravastatin  80 mg Oral Daily  . sodium chloride  3 mL Intravenous Q12H  . thiamine  100 mg Oral Daily  . tuberculin  5 Units Intradermal Once   Infusions: . sodium chloride 100 mL/hr at 12/19/14 1538   PRN Meds: acetaminophen **OR** acetaminophen, alum & mag hydroxide-simeth, baclofen, HYDROcodone-acetaminophen, HYDROmorphone (DILAUDID) injection, ondansetron **OR** ondansetron (ZOFRAN) IV   Allergies as of 12/17/2014  . (No Known Allergies)    Family History  Problem Relation Age of Onset  . Alzheimer's disease Mother 68  . Heart failure Father 4  . Immunocompromised Sister   . COPD Sister   . Migraines Daughter   . Immunocompromised Daughter   . Stroke Maternal Grandmother     History   Social History  . Marital Status:  Divorced    Spouse Name: N/A  . Number of Children: 2  . Years of Education: 14   Occupational History  . Disability    Social History Main Topics  . Smoking status: Former Smoker    Quit date: 04/26/2010  . Smokeless tobacco: Never Used  . Alcohol Use: 16.8 oz/week    28 Cans of beer per week     Comment: Pt drinks daily  . Drug Use: No  . Sexual Activity:    Partners: Female   Other Topics Concern  . Not on file   Social History Narrative   Fun: Watch TV   Denies religious beliefs effecting health care.     REVIEW OF SYSTEMS: Constitutional:  Per HPI ENT:  No nose bleeds Pulm:  Some cough, not new.  No SOB.  Quit smoking 2011 CV:  No palpitations, no LE edema.  GU:  No hematuria, no frequency GI:  Per HPI Heme:  No issues with unusual or excessive bleeding/bruising   Transfusions:  None.  Neuro:  No headaches, no peripheral tingling or numbness Derm:  No itching, no rash or sores. C/o excessive tearing and scratchy eyes.  Endocrine:  No sweats or chills.  No polyuria or dysuria Immunization:  No vaccinations entered in Epic.  Travel:  None beyond local counties in last few months.    PHYSICAL EXAM: Vital signs in last 24 hours: Filed Vitals:   12/20/14 0400  BP: 139/67  Pulse: 103  Temp: 98 F (36.7 C)  Resp: 14   Wt Readings from Last 3 Encounters:  12/20/14 121 lb 4.1 oz (55 kg)  11/14/14 125 lb (56.7 kg)  09/27/14 125 lb 9.6 oz (56.972 kg)    General: thin, pleasant, looks unwell.  comfortable Head:  Slight drop on left side of mouth.  No swelling or signs of trauma  Eyes:  No icterus or pallor Ears:  Hearing intact.   Nose:  No congestion or discharge.  Mouth:  Poor dentition, lots of caries and many just nubs of black teeth broken off at gum line. Moist and pink MM Neck:  No mass or JVD.  No TMG Lungs:  Clear bil.  No labored breathing or cough.  Smoker's raspy voice.  Heart: RRR Abdomen:  Thin, soft, NT, ND.  Active BS.  No palpable masses.   No bruits, no hernias.   Rectal: deferred   Musc/Skeltl:  no joint swelling, redness.  Slight contracture of right UE.   Oriented x 3.  Extremities:  No CCE  Neurologic:  Right UE and lower extremity 2/5.  No tremor.  Skin:  No telangectasia, sores or rashes.  Excessive sun exposure with leathery, tanned skin.  Tattoos:  None seen Nodes:  No cervical adenopathy.    Psych:  Cooperative, relaxed, relaxed, pleasant.   Intake/Output from previous day: 05/24 0701 - 05/25 0700 In: -  Out: 3855 [Urine:3855] Intake/Output this shift: Total I/O In: -  Out: 875 [Urine:875]  LAB RESULTS:  Recent Labs  12/17/14 1520 12/18/14 0635 12/20/14 0620  WBC 12.4* 8.6 11.2*  HGB 15.7 13.2 12.6*  HCT 43.0 37.0* 36.2*  PLT 304 241 251   BMET Lab Results  Component Value Date   NA 127* 12/20/2014   NA 125* 12/19/2014   NA 126* 12/18/2014   K 3.7 12/20/2014   K 3.4* 12/19/2014   K 4.7 12/18/2014   CL 93* 12/20/2014   CL 92* 12/19/2014   CL 95* 12/18/2014   CO2 26 12/20/2014   CO2 21* 12/19/2014   CO2 26 12/18/2014   GLUCOSE 88 12/20/2014   GLUCOSE 81 12/19/2014   GLUCOSE 82 12/18/2014   BUN 7 12/20/2014   BUN 9 12/19/2014   BUN 7 12/18/2014   CREATININE 0.79 12/20/2014   CREATININE 0.90 12/19/2014   CREATININE 1.07 12/18/2014   CALCIUM 8.4* 12/20/2014   CALCIUM 8.2* 12/19/2014   CALCIUM 8.1* 12/18/2014   LFT  Recent Labs  12/17/14 1520 12/18/14 0635  PROT 7.7 5.9*  ALBUMIN 4.2 3.1*  AST 166* 97*  ALT 40 30  ALKPHOS 96 69  BILITOT 1.0 0.7   PT/INR Lab Results  Component Value Date   INR 1.06 08/16/2014   INR 0.89 04/22/2010   Hepatitis Panel No results for input(s): HEPBSAG, HCVAB, HEPAIGM, HEPBIGM in the last 72 hours.   Drugs of Abuse  No results found for: LABOPIA, COCAINSCRNUR, LABBENZ, AMPHETMU, THCU, LABBARB   RADIOLOGY STUDIES: Mr Virgel Paling Wo Contrast  12/18/2014   CLINICAL DATA:  69 year old hypertensive male with small acute infarct posterior left  coronal radiata. Subsequent encounter.  EXAM: MRA HEAD WITHOUT CONTRAST  TECHNIQUE: Angiographic images of the Circle of Willis were obtained using MRA technique without intravenous contrast.  COMPARISON:  12/17/2014 brain MR. 04/23/2010 MR angiogram circle Willis.  FINDINGS: Right internal carotid artery pre cavernous segment posterior bulges measuring up to 2 mm similar to prior exam. Intervening stenosis. Findings may reflect result of atherosclerotic type changes versus focal aneurysm.  Moderate narrowing junction cavernous/ petrous segment right internal carotid artery  Moderate ectasia cavernous segment right internal carotid artery.  Moderate narrowing at the junction of the pre cavernous cavernous segment left internal carotid artery.  No significant stenosis of either carotid terminus, M1 segment of the middle cerebral artery or A1 segment of the anterior cerebral artery.  Middle cerebral artery mild to slightly moderate branch vessel irregularity bilaterally.  Left vertebral artery is dominant. Moderate narrowing distal right vertebral artery.  Poor delineation of the posterior inferior cerebellar arteries.  Ectatic basilar artery without significant stenosis.  Fetal type contribution to the right posterior cerebral artery.  Mild narrowing proximal left posterior cerebral artery.  Posterior cerebral artery distal branch vessel mild moderate irregularity bilaterally.  IMPRESSION: Progressive atherosclerotic type changes as detailed above.  The focal bulges of the right internal carotid artery pre cavernous segment may reflect result of atherosclerotic disease with aneurysm a  secondary consideration. Overall appearance at this level is similar to prior exam. Please see above for further detail.   Electronically Signed   By: Genia Del M.D.   On: 12/18/2014 16:10   US Abdomen Limited  12/19/2014   CLINICAL DATA:  Patient with liver mass identified on prior echocardiography.  EXAM: US ABDOMEN LIMITED -  RIGHT UPPER QUADRANT  COMPARISON:  None.  FINDINGS: Gallbladder:  The gallbladder is contracted. No gallbladder wall thickening and pericholecystic fluid. Negative sonographic Murphy sign.  Common bile duct:  Diameter: 4 mm  Liver:  Multiple isoechoic hepatic masses are demonstrated including a 6.6 x 5.5 x 7.8 cm mass and a 5.3 x 5.0 x 5.1 cm mass.  IMPRESSION: Multiple large hepatic masses which are indeterminate on ultrasound evaluation. Patient needs further evaluation with pre and post contrast-enhanced CT or MRI for definitive characterization.  Decompressed gallbladder.  These results will be called to the ordering clinician or representative by the Radiologist Assistant, and communication documented in the PACS or zVision Dashboard.   Electronically Signed   By: Lovey Newcomer M.D.   On: 12/19/2014 16:37   Ct Abd Wo & W Cm  12/20/2014   CLINICAL DATA:  Liver masses found on ultrasound.  EXAM: CT ABDOMEN WITHOUT AND WITH CONTRAST  TECHNIQUE: Multidetector CT imaging of the abdomen was performed following the standard protocol before and following the bolus administration of intravenous contrast.  CONTRAST:  197m OMNIPAQUE IOHEXOL 300 MG/ML  SOLN  COMPARISON:  Liver ultrasound 12/19/2014  FINDINGS: BODY WALL: No contributory findings.  LOWER CHEST: No contributory findings.  ABDOMEN/PELVIS:  Liver: There are 8 hypo-enhancing masses randomly distributed throughout the liver, the largest spanning segments 2 and 3 at 8 cm. The larger have a roughly targetoid enhancement pattern. There is no indication of cirrhosis. Sub cm nonenhancing cyst noted in the right liver ventral to the inferior portal vein.  Biliary: No evidence of biliary obstruction or stone.  Pancreas: There is a hypoenhancing 2 cm mass in the ventral neck of the pancreas. No effect on the main duct. No peripancreatic adenopathy or fat infiltration.  Spleen: Unremarkable.  Adrenals: 14 mm nodule in the right adrenal gland which does not meet criteria  for adenoma based on precontrast imaging. Although there are multiple phases, no 15 minute delay for definitive washout characterization.  Kidneys and ureters: Severe smooth atrophy of the right kidney, likely from high-grade renal artery stenosis.  Bowel: No obstruction or inflammation of the visualized portions.  Peritoneum: No ascites or pneumoperitoneum.  Vascular: Extensive atherosclerosis of the aorta and branch vessels. Bulky calcified atheroma at the level of the renal arteries causes moderate luminal stenosis. There is a partly visible fusiform aortic aneurysm, infrarenal, measuring up to 42 mm and containing peripheral thrombus.  OSSEOUS: No acute abnormalities.  IMPRESSION: 1. 8 liver masses are consistent with metastases, the largest in the left liver measuring 8 cm. A 2 cm hypoenhancing pancreatic neck mass is noted and could reflect a primary pancreatic adenocarcinoma. 2. Indeterminate 14 mm right adrenal nodule. 3. 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 Atherosclerosis is extensive and chronic right renal artery stenosis accounts for severe right renal atrophy.   Electronically Signed   By: JMonte FantasiaM.D.   On: 12/20/2014 06:37    ENDOSCOPIC STUDIES: Nothing in Epic  IMPRESSION:   *  Multiple liver masses, pancreatic neck mass.  Elevated AST (improving), c/w mild alcoholic hepatitis. Coags normal.   *  Long hx ETOH (  beer) abuse.   *  New CVA.  Hx CVA 2011.    *  Prolonged down time, rhabdo.  CKs improved.   *  Hyponatremia, acute on chronic.   *  Atherosclerosis on abdominal imaging with 4.2 Cm AAA.   Right renal artery stenosis with associated atrophy. Renal function preserved.    PLAN:     *  Liver biopsy, per IR: ordered.  D/w pt and his dtr.    Azucena Freed  12/20/2014, 8:15 AM Pager: [phone]

## 2014-12-20 NOTE — Progress Notes (Signed)
Chart reviewed. IR planning US guided liver bx on 5/31 to allow time for Plavix washout. No current evidence of liver dysfunction or biliary obstruction Await bx results which will guide next step in eval/treatment.  GI available, please call with questions

## 2014-12-20 NOTE — H&P (Signed)
Chief Complaint: Liver mass  Referring Physician(s): Dr. Jerilynn Mages.  Pyrtle Azucena Freed PA-C  History of Present Illness: Hector Rice is a 69 y.o. male who is admitted after syncopal episode.  He is on Plavix for history of CVA in 2011  He has residual right hemiparesis. Wheelchair bound but can self transfer.   He does drink 6 beers daily and used to drink more.   This admission he was fount to have acute CVA on head CT.   2 liver masses seen on cardiac 2d echo.   Abd [place]: multiple large liver masses.   CT abdomen: 8 liver masses, largest 8 cm. 2 cm mass in pancreatic neck.   Indeterminate right adrenal nodule. 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  We are asked to evaluate him for US guided Liver Biopsy   Past Medical History  Diagnosis Date  . Hypertension   . Memory loss   . Stroke 2011    Right sided hemiparesis    Past Surgical History  Procedure Laterality Date  . Hand surgery      Allergies: Review of patient's allergies indicates no known allergies.  Medications: Prior to Admission medications   Medication Sig Start Date End Date Taking? Authorizing Provider  amLODipine (NORVASC) 10 MG tablet Take 1 tablet (10 mg total) by mouth daily. 11/14/14  Yes Golden Circle, FNP  aspirin EC 81 MG tablet Take 81 mg by mouth daily.   Yes Historical Provider, MD  baclofen (LIORESAL) 10 MG tablet Take 1 tablet (10 mg total) by mouth 4 (four) times daily. Patient taking differently: Take 10 mg by mouth daily as needed for muscle spasms.  09/27/14  Yes Pieter Partridge, DO  Cholecalciferol (VITAMIN D3 PO) Take 1 tablet by mouth daily.   Yes Historical Provider, MD  FOLIC ACID PO Take 1 tablet by mouth daily.   Yes Historical Provider, MD  lisinopril (PRINIVIL,ZESTRIL) 40 MG tablet Take 1 tablet (40 mg total) by mouth every morning. 11/14/14  Yes Golden Circle, FNP  pravastatin (PRAVACHOL) 80 MG tablet Take 1 tablet (80 mg total) by mouth daily. 11/14/14  Yes Golden Circle, FNP    Thiamine HCl (VITAMIN B-1 PO) Take 1 tablet by mouth daily.   Yes Historical Provider, MD     Family History  Problem Relation Age of Onset  . Alzheimer's disease Mother 83  . Heart failure Father 77  . Immunocompromised Sister   . COPD Sister   . Migraines Daughter   . Immunocompromised Daughter   . Stroke Maternal Grandmother     History   Social History  . Marital Status: Divorced    Spouse Name: N/A  . Number of Children: 2  . Years of Education: 14   Occupational History  . Disability    Social History Main Topics  . Smoking status: Former Smoker    Quit date: 04/26/2010  . Smokeless tobacco: Never Used  . Alcohol Use: 16.8 oz/week    28 Cans of beer per week     Comment: Pt drinks daily  . Drug Use: No  . Sexual Activity:    Partners: Female   Other Topics Concern  . None   Social History Narrative   Fun: Watch TV   Denies religious beliefs effecting health care.      Review of Systems: A 12 point ROS discussed and pertinent positives are indicated in the HPI above.  All other systems are negative.  Review of Systems  Constitutional: Negative for fever, chills, activity change and appetite change.  HENT: Negative.   Respiratory: Negative for cough, chest tightness and shortness of breath.   Cardiovascular: Negative for chest pain.  Gastrointestinal: Negative for abdominal pain and abdominal distention.  Musculoskeletal: Positive for gait problem. Negative for back pain and arthralgias.       Residual right sided hemiparesis  Neurological: Positive for weakness.  Psychiatric/Behavioral: Negative.     Vital Signs: BP 146/79 mmHg  Pulse 104  Temp(Src) 98.2 F (36.8 C) (Oral)  Resp 18  Ht '5\' 9"'$  (1.753 m)  Wt 121 lb 4.1 oz (55 kg)  BMI 17.90 kg/m2  SpO2 98%  Physical Exam  Constitutional: He appears well-developed and well-nourished.  HENT:  Head: Normocephalic and atraumatic.  Eyes: EOM are normal.  Neck: Neck supple.  Cardiovascular:  Normal rate, regular rhythm and normal heart sounds.   Pulmonary/Chest: Effort normal and breath sounds normal.  Abdominal: Soft. Bowel sounds are normal. He exhibits no mass. There is no tenderness.  Musculoskeletal:  Right sided hemiparesis  Skin: Skin is warm and dry.  Psychiatric: He has a normal mood and affect. His behavior is normal. Judgment and thought content normal.  Vitals reviewed.   Mallampati Score:  MD Evaluation Airway: WNL Heart: WNL Abdomen: WNL Chest/ Lungs: WNL ASA  Classification: 3 Mallampati/Airway Score: One  Imaging: Dg Thoracic Spine W/swimmers  12/17/2014   CLINICAL DATA:  Pain along the base of the left neck.  EXAM: THORACIC SPINE - 2 VIEW + SWIMMERS  COMPARISON:  04/25/2010  FINDINGS: Atherosclerotic calcification of the aortic arch. Minimal thoracic spondylosis. No thoracic spine compression or malalignment. Appearance of the lung parenchyma suggests emphysema.  IMPRESSION: 1. Emphysema. 2. Atherosclerotic aortic arch. 3. Mild thoracic spondylosis. No thoracic malalignment or compression identified.   Electronically Signed   By: Van Clines M.D.   On: 12/17/2014 21:39   Ct Head Wo Contrast  12/17/2014   CLINICAL DATA:  Fall.  EXAM: CT HEAD WITHOUT CONTRAST  CT CERVICAL SPINE WITHOUT CONTRAST  TECHNIQUE: Multidetector CT imaging of the head and cervical spine was performed following the standard protocol without intravenous contrast. Multiplanar CT image reconstructions of the cervical spine were also generated.  COMPARISON:  08/16/2014 head CT.  FINDINGS: CT HEAD FINDINGS  There is atrophy and chronic small vessel disease changes. Old bilateral thalamic lacunar infarcts. No acute intracranial abnormality. Specifically, no hemorrhage, hydrocephalus, mass lesion, acute infarction, or significant intracranial injury. No acute calvarial abnormality. Visualized paranasal sinuses and mastoids clear. Orbital soft tissues unremarkable.  CT CERVICAL SPINE  FINDINGS  Normal alignment. Degenerative disc disease changes diffusely with disc space narrowing and spurring. Prevertebral soft tissues are normal. No fracture. No epidural or paraspinal hematoma. Dense carotid vascular calcifications bilaterally. Small calcified and noncalcified nodules within the thyroid. Scarring noted in the apices of the lungs bilaterally with emphysematous changes.  IMPRESSION: No acute intracranial abnormality. Atrophy, chronic small vessel disease.  No acute bony abnormality in the cervical spine.   Electronically Signed   By: Rolm Baptise M.D.   On: 12/17/2014 16:08   Ct Cervical Spine Wo Contrast  12/17/2014   CLINICAL DATA:  Fall.  EXAM: CT HEAD WITHOUT CONTRAST  CT CERVICAL SPINE WITHOUT CONTRAST  TECHNIQUE: Multidetector CT imaging of the head and cervical spine was performed following the standard protocol without intravenous contrast. Multiplanar CT image reconstructions of the cervical spine were also generated.  COMPARISON:  08/16/2014 head CT.  FINDINGS: CT HEAD FINDINGS  There is atrophy and chronic small vessel disease changes. Old bilateral thalamic lacunar infarcts. No acute intracranial abnormality. Specifically, no hemorrhage, hydrocephalus, mass lesion, acute infarction, or significant intracranial injury. No acute calvarial abnormality. Visualized paranasal sinuses and mastoids clear. Orbital soft tissues unremarkable.  CT CERVICAL SPINE FINDINGS  Normal alignment. Degenerative disc disease changes diffusely with disc space narrowing and spurring. Prevertebral soft tissues are normal. No fracture. No epidural or paraspinal hematoma. Dense carotid vascular calcifications bilaterally. Small calcified and noncalcified nodules within the thyroid. Scarring noted in the apices of the lungs bilaterally with emphysematous changes.  IMPRESSION: No acute intracranial abnormality. Atrophy, chronic small vessel disease.  No acute bony abnormality in the cervical spine.    Electronically Signed   By: Rolm Baptise M.D.   On: 12/17/2014 16:08   Mr Jodene Nam Head Wo Contrast  12/18/2014   CLINICAL DATA:  69 year old hypertensive male with small acute infarct posterior left coronal radiata. Subsequent encounter.  EXAM: MRA HEAD WITHOUT CONTRAST  TECHNIQUE: Angiographic images of the Circle of Willis were obtained using MRA technique without intravenous contrast.  COMPARISON:  12/17/2014 brain MR. 04/23/2010 MR angiogram circle Willis.  FINDINGS: Right internal carotid artery pre cavernous segment posterior bulges measuring up to 2 mm similar to prior exam. Intervening stenosis. Findings may reflect result of atherosclerotic type changes versus focal aneurysm.  Moderate narrowing junction cavernous/ petrous segment right internal carotid artery  Moderate ectasia cavernous segment right internal carotid artery.  Moderate narrowing at the junction of the pre cavernous cavernous segment left internal carotid artery.  No significant stenosis of either carotid terminus, M1 segment of the middle cerebral artery or A1 segment of the anterior cerebral artery.  Middle cerebral artery mild to slightly moderate branch vessel irregularity bilaterally.  Left vertebral artery is dominant. Moderate narrowing distal right vertebral artery.  Poor delineation of the posterior inferior cerebellar arteries.  Ectatic basilar artery without significant stenosis.  Fetal type contribution to the right posterior cerebral artery.  Mild narrowing proximal left posterior cerebral artery.  Posterior cerebral artery distal branch vessel mild moderate irregularity bilaterally.  IMPRESSION: Progressive atherosclerotic type changes as detailed above.  The focal bulges of the right internal carotid artery pre cavernous segment may reflect result of atherosclerotic disease with aneurysm a secondary consideration. Overall appearance at this level is similar to prior exam. Please see above for further detail.   Electronically  Signed   By: Genia Del M.D.   On: 12/18/2014 16:10   Mr Brain Wo Contrast  12/17/2014   CLINICAL DATA:  Initial evaluation acute syncope with increased right-sided weakness.  EXAM: MRI HEAD WITHOUT CONTRAST  TECHNIQUE: Multiplanar, multiecho pulse sequences of the brain and surrounding structures were obtained without intravenous contrast.  COMPARISON:  Prior CT from earlier the same day.  FINDINGS: Study is moderately degraded by motion artifact.  Diffuse prominence of the CSF containing spaces is compatible with generalized cerebral atrophy. Patchy and confluent T2/FLAIR hyperintensity within the periventricular and deep white matter both cerebral hemispheres most consistent with chronic small vessel ischemic disease. Multiple remote lacunar infarcts involve the bilateral basal ganglia, specifically within the thalami. Additional small remote lacunar infarct present within the periventricular white matter of the right corona radiata.  There is a small acute ischemic infarct involving the periventricular white matter of the left corona radiata (series 9, image 22). This appears somewhat curvilinear in nature on coronal DWI sequence, measuring approximately 1 cm in length (series 10, image 14). No associated hemorrhage or  significant mass effect. No other acute intracranial infarct. Normal intravascular flow voids preserved.  No mass lesion or midline shift. No mass effect. Ventricular prominence related global parenchymal volume loss present without hydrocephalus. No extra-axial fluid collection.  Craniocervical junction within normal limits. Visualized upper cervical spine is unremarkable. Pituitary gland grossly normal.  No acute abnormality about the orbits. Sequelae of prior lens extraction noted bilaterally.  Minimal mucosal thickening present within the right maxillary sinus. Paranasal sinuses are otherwise clear. No mastoid effusion. Inner ear structures normal.  Bone marrow signal intensity within  normal limits. Scalp soft tissues unremarkable.  IMPRESSION: 1. Small acute approximately 1 cm curvilinear ischemic infarct involving the periventricular white matter of the left corona radiata. No significant mass effect or associated hemorrhage. 2. No other acute intracranial process. 3. Multiple remote lacunar infarcts involving the bilateral basal ganglia as well as the periventricular white matter of the right corona radiata. 4. Generalized cerebral atrophy with advanced chronic microvascular ischemic disease.   Electronically Signed   By: Jeannine Boga M.D.   On: 12/17/2014 22:51   US Abdomen Limited  12/19/2014   CLINICAL DATA:  Patient with liver mass identified on prior echocardiography.  EXAM: US ABDOMEN LIMITED - RIGHT UPPER QUADRANT  COMPARISON:  None.  FINDINGS: Gallbladder:  The gallbladder is contracted. No gallbladder wall thickening and pericholecystic fluid. Negative sonographic Murphy sign.  Common bile duct:  Diameter: 4 mm  Liver:  Multiple isoechoic hepatic masses are demonstrated including a 6.6 x 5.5 x 7.8 cm mass and a 5.3 x 5.0 x 5.1 cm mass.  IMPRESSION: Multiple large hepatic masses which are indeterminate on ultrasound evaluation. Patient needs further evaluation with pre and post contrast-enhanced CT or MRI for definitive characterization.  Decompressed gallbladder.  These results will be called to the ordering clinician or representative by the Radiologist Assistant, and communication documented in the PACS or zVision Dashboard.   Electronically Signed   By: Lovey Newcomer M.D.   On: 12/19/2014 16:37   Ct Abd Wo & W Cm  12/20/2014   CLINICAL DATA:  Liver masses found on ultrasound.  EXAM: CT ABDOMEN WITHOUT AND WITH CONTRAST  TECHNIQUE: Multidetector CT imaging of the abdomen was performed following the standard protocol before and following the bolus administration of intravenous contrast.  CONTRAST:  178m OMNIPAQUE IOHEXOL 300 MG/ML  SOLN  COMPARISON:  Liver ultrasound  12/19/2014  FINDINGS: BODY WALL: No contributory findings.  LOWER CHEST: No contributory findings.  ABDOMEN/PELVIS:  Liver: There are 8 hypo-enhancing masses randomly distributed throughout the liver, the largest spanning segments 2 and 3 at 8 cm. The larger have a roughly targetoid enhancement pattern. There is no indication of cirrhosis. Sub cm nonenhancing cyst noted in the right liver ventral to the inferior portal vein.  Biliary: No evidence of biliary obstruction or stone.  Pancreas: There is a hypoenhancing 2 cm mass in the ventral neck of the pancreas. No effect on the main duct. No peripancreatic adenopathy or fat infiltration.  Spleen: Unremarkable.  Adrenals: 14 mm nodule in the right adrenal gland which does not meet criteria for adenoma based on precontrast imaging. Although there are multiple phases, no 15 minute delay for definitive washout characterization.  Kidneys and ureters: Severe smooth atrophy of the right kidney, likely from high-grade renal artery stenosis.  Bowel: No obstruction or inflammation of the visualized portions.  Peritoneum: No ascites or pneumoperitoneum.  Vascular: Extensive atherosclerosis of the aorta and branch vessels. Bulky calcified atheroma at the level of  the renal arteries causes moderate luminal stenosis. There is a partly visible fusiform aortic aneurysm, infrarenal, measuring up to 42 mm and containing peripheral thrombus.  OSSEOUS: No acute abnormalities.  IMPRESSION: 1. 8 liver masses are consistent with metastases, the largest in the left liver measuring 8 cm. A 2 cm hypoenhancing pancreatic neck mass is noted and could reflect a primary pancreatic adenocarcinoma. 2. Indeterminate 14 mm right adrenal nodule. 3. 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 Atherosclerosis is extensive and chronic right renal artery stenosis accounts for severe right renal atrophy.   Electronically Signed   By: Monte Fantasia M.D.   On: 12/20/2014 06:37     Labs:  CBC:  Recent Labs  08/16/14 1447 08/16/14 1458 12/17/14 1520 12/18/14 0635 12/20/14 0620  WBC 11.5*  --  12.4* 8.6 11.2*  HGB 13.7 15.3 15.7 13.2 12.6*  HCT 37.9* 45.0 43.0 37.0* 36.2*  PLT 300  --  304 241 251    COAGS:  Recent Labs  08/16/14 1447  INR 1.06  APTT 27    BMP:  Recent Labs  12/17/14 1520 12/18/14 0635 12/19/14 0342 12/20/14 0620  NA 121* 126* 125* 127*  K 3.6 4.7 3.4* 3.7  CL 83* 95* 92* 93*  CO2 24 26 21* 26  GLUCOSE 94 82 81 88  BUN '6 7 9 7  '$ CALCIUM 8.5* 8.1* 8.2* 8.4*  CREATININE 1.01 1.07 0.90 0.79  GFRNONAA >60 >60 >60 >60  GFRAA >60 >60 >60 >60    LIVER FUNCTION TESTS:  Recent Labs  08/16/14 1447 12/17/14 1520 12/18/14 0635  BILITOT 0.6 1.0 0.7  AST 23 166* 97*  ALT 14 40 30  ALKPHOS 64 96 69  PROT 6.7 7.7 5.9*  ALBUMIN 3.9 4.2 3.1*    TUMOR MARKERS: No results for input(s): AFPTM, CEA, CA199, CHROMGRNA in the last 8760 hours.  Assessment and Plan:  Liver Masses Alcohol use CVA= Will Hold Plavix x 5 days per protocol, continue Lovenox, will need to hold lovenox Monday night for procedure on Tuesday. Will plan for US guided Liver biopsy on Tuesday 12/26/14   Risks and Benefits discussed with the patient including, but not limited to bleeding, infection, damage to adjacent structures or low yield requiring additional tests. All of the patient's questions were answered, patient is agreeable to proceed. Consent signed and in chart.   Thank you for this interesting consult.  I greatly enjoyed meeting Hector Rice and look forward to participating in their care.  SignedNevin Bloodgood 12/20/2014, 3:32 PM   I spent a total of 20 Minutes    in face to face in clinical consultation, greater than 50% of which was counseling/coordinating care for US Liver biopsy

## 2014-12-21 ENCOUNTER — Telehealth: Payer: Self-pay | Admitting: *Deleted

## 2014-12-21 DIAGNOSIS — K8689 Other specified diseases of pancreas: Secondary | ICD-10-CM

## 2014-12-21 DIAGNOSIS — I6309 Cerebral infarction due to thrombosis of other precerebral artery: Secondary | ICD-10-CM

## 2014-12-21 DIAGNOSIS — I714 Abdominal aortic aneurysm, without rupture, unspecified: Secondary | ICD-10-CM

## 2014-12-21 LAB — COMPREHENSIVE METABOLIC PANEL
ALBUMIN: 3.1 g/dL — AB (ref 3.5–5.0)
ALK PHOS: 75 U/L (ref 38–126)
ALT: 25 U/L (ref 17–63)
ANION GAP: 10 (ref 5–15)
AST: 35 U/L (ref 15–41)
BILIRUBIN TOTAL: 0.6 mg/dL (ref 0.3–1.2)
BUN: 12 mg/dL (ref 6–20)
CO2: 26 mmol/L (ref 22–32)
Calcium: 8.4 mg/dL — ABNORMAL LOW (ref 8.9–10.3)
Chloride: 91 mmol/L — ABNORMAL LOW (ref 101–111)
Creatinine, Ser: 0.83 mg/dL (ref 0.61–1.24)
GFR calc Af Amer: >60 mL/min (ref >60)
GLUCOSE: 78 mg/dL (ref 65–99)
Potassium: 3 mmol/L — ABNORMAL LOW (ref 3.5–5.1)
SODIUM: 127 mmol/L — AB (ref 135–145)
Total Protein: 6 g/dL — ABNORMAL LOW (ref 6.5–8.1)

## 2014-12-21 LAB — CEA: CEA: 4.9 ng/mL — AB (ref 0.0–4.7)

## 2014-12-21 LAB — CANCER ANTIGEN 19-9: CA 19-9: 61 U/mL — ABNORMAL HIGH (ref 0–35)

## 2014-12-21 MED ORDER — POTASSIUM CHLORIDE CRYS ER 20 MEQ PO TBCR
40.0000 meq | EXTENDED_RELEASE_TABLET | Freq: Once | ORAL | Status: AC
  Administered 2014-12-21: 40 meq via ORAL
  Filled 2014-12-21: qty 2

## 2014-12-21 MED ORDER — ASPIRIN 325 MG PO TBEC: 325.0000 mg | DELAYED_RELEASE_TABLET | Freq: Every day | ORAL | Status: DC

## 2014-12-21 MED ORDER — PRO-STAT SUGAR FREE PO LIQD: 30.0000 mL | Freq: Two times a day (BID) | ORAL | Status: AC

## 2014-12-21 MED ORDER — CLOPIDOGREL BISULFATE 75 MG PO TABS: 75.0000 mg | ORAL_TABLET | Freq: Every day | ORAL | Status: DC

## 2014-12-21 MED ORDER — ASPIRIN 81 MG PO TBEC: 81.0000 mg | DELAYED_RELEASE_TABLET | Freq: Every day | ORAL | Status: AC

## 2014-12-21 MED ORDER — ADULT MULTIVITAMIN W/MINERALS CH: 1.0000 | ORAL_TABLET | Freq: Every day | ORAL | Status: AC

## 2014-12-21 NOTE — Telephone Encounter (Signed)
Transition Care Management Follow-up Telephone Call D/C 12/21/14  How have you been since you were released from the hospital? Pt states he is doing ok   Do you understand why you were in the hospital? YES   Do you understand the discharge instrcutions? YES  Items Reviewed:  Medications reviewed: YES  Allergies reviewed: YES  Dietary changes reviewed: YES, low sodium & heart healthy  Referrals reviewed: No referral needed   Functional Questionnaire:   Activities of Daily Living (ADLs):   He states he are independent in the following: ambulation, bathing and hygiene, feeding, continence, grooming, toileting and dressing States he doesn't require assistance   Any transportation issues/concerns?: YES   Any patient concerns? NO   Confirmed importance and date/time of follow-up visits scheduled: YES, appt made 12/27/14 w/Greg  Calone   Confirmed with patient if condition begins to worsen call PCP or go to the ER.

## 2014-12-21 NOTE — Discharge Summary (Signed)
Physician Discharge Summary  Dow Blahnik FIE:[id number] DOB: 11/14/1945 DOA: 12/17/2014  PCP: Mauricio Po, FNP  Admit date: 12/17/2014 Discharge date: 12/21/2014  Time spent: 50 minutes    Code Status: DO NOT RESUSCITATE  Recommendations for Outpatient Follow-up:  1. IR guided biopsy of the liver  2. Thyroid function studies to be repeated in 3-4 wks 3. He will be transitioned to an independent living facility- [facility] 4. Follow-up on sodium levels in 1 week  Discharge Condition: stable Diet recommendation: low sodium, heart healthy  Discharge Diagnoses:  Principal Problem:   Syncope Active Problems:   CVA (cerebral infarction)   Liver masses   Pancreatic mass   Essential hypertension   Right spastic hemiparesis   Hyponatremia   Rhabdomyolysis   Protein-calorie malnutrition, severe   HLD (hyperlipidemia)   Alcohol abuse   Renal atrophy, right   AAA (abdominal aortic aneurysm)  PVD  History of present illness:  Hector Rice is a 69 y.o. male with a history of CVA with right-sided hemiparesis, hypertension who was admitted for passing out in his bathroom. He is apparently on the floor for about 8 hours prior to being found by his sister. The patient recalls passing out and does not recall having any neurological or cardiac symptoms prior to the episode. He does state he had one large loose stool earlier that morning. He did not have any vomiting abdominal pain or fevers. He has not had any stools since that episode. He was brought into the ER and was found to have a sodium of 121-baseline is 127. He was also found to have rhabdomyolysis. He was admitted for treatment and further workup of syncope. Workup revealed an acute CVA. Per neurology this likely did not cause his syncope. Further workup of the CVA revealed liver masses noted incidentally on 2-D echo. Further imaging revealed multiple liver masses along with a pancreatic mass. Work up underway.  Hospital Course:   Principal Problem:  Syncope -Suspected to be related to dehydration, hyponatremia or vasovagal in nature. -Further workup with MRI revealed an acute CVA which was not likely the cause of the syncope-see below - 2-D echo has been performed and is unrevealing other than mild diastolic dysfunction-see report below - EEG and continue to follow on telemetry  Active Problems:  CVA (cerebral infarction) -per neuro notes "Dominant left corona radiata infarct likely secondary to small vessel disease due to multiple stroke risk factors and diffuse atherosclerotic changes vs hypercoagulability from newly diagnosed advanced malignancy" -recommended to take ASA 81 mg and Plavix for 3 months and then plavix alone due to intracranial stenosis - for now Plavis is on hold until liver biopsy completed - lipid panel is within normal limits -Continue statin - carotid Dopplers do not reveal significant stenosis- see report below - LE venous Doppler ordered by neuro- does not reveal DVT but does show stenosis- see report below - hemoglobin A1c normal at 5.5  PVD - see report from venous duplex below- cont statin and dual antiplatelet therpy  Rhabdomyolysis -The result of laying on the floor for close to 8 hours - CPK has been improving with hydration-we'll stop normal saline today  Liver masses and pancreatic neck mass -Incidental finding of liver masses when 2-D echo was done -Further evaluation with an ultrasound of the abdomen was ordered which revealed multiple large hepatic masses and a CT scan was recommended -CT of the abdomen and pelvis performed yesterday reveals 8 liver masses consistent with metastasis the largest is in  the left liver measuring 8 cm-there is also a 2 cm hypoenhancing pancreatic neck mass and indeterminant 14 mm right renal nodule -GI consult requested-IR guided biopsy of hepatic metastasis requested -Patient's daughter mentions that he has been losing weight this past year-she  is unable to tell how much weight he has lost  Infrarenal aortic aneurysm -Measuring 42 mm-found incidentally on abdominal CT  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 -He had one episode prior to admission and has not had any further  Abnormal thyroid studies -TSH is 0.860 which is normal - free T4 is mildly elevated at 1.17, upper limit of normal being 1.12 -recommend repeating thyroid functions as outpatient  Daily alcohol abuse/highly elevated LFTs -No significant withdrawal symptoms noted--no cirrhosis noted on imaging  Severe protein calorie malnutrition -Started Protostat as recommended by dietitian  Consultants: Neurology, GI, IR Procedures: 2D echo Grade 1 diastolic dysfunction, trivial mitral and tricuspid regurgitation, otherwise normal study.  Carotid Duplex Right = 1-39 % ICA stenosis. Right ECA stenosis noted. Left = 40-59% ICA stenosis.  LE venous doppler  - Preliminary findings: Negative for DVT. Incidental findings: 50-99% stenosis of the right CFA, 50-99% stenosis of the right PFA, and occlusion of the right proximal and mid femoral artery with recanalized flow to distal FA.  50-99% stenosis of the left PFA and 50-99% stenosis of the left femoral artery. Monophasic flow in bilateral PTA.  Discharge Exam: Filed Weights   12/19/14 0400 12/20/14 0400 12/21/14 0400  Weight: 55.2 kg (121 lb 11.1 oz) 55 kg (121 lb 4.1 oz) 55.384 kg (122 lb 1.6 oz)   Filed Vitals:   12/21/14 0400  BP: 131/62  Pulse: 88  Temp: 98.1 F (36.7 C)  Resp:     General: AAO x 3, no  distress Cardiovascular: RRR, no murmurs  Respiratory: clear to auscultation bilaterally GI: soft, non-tender, non-distended, bowel sound positive  Discharge Instructions You were cared for by a hospitalist during your hospital stay. If you have any questions about your discharge medications or the care you received while you were in the hospital after you are discharged, you can call the unit and asked to speak with the hospitalist on call if the hospitalist that took care of you is not available. Once you are discharged, your primary care physician will handle any further medical issues. Please note that NO REFILLS for any discharge medications will be authorized once you are discharged, as it is imperative that you return to your primary care physician (or establish a relationship with a primary care physician if you do not have one) for your aftercare needs so that they can reassess your need for medications and monitor your lab values.      Discharge Instructions    Diet - low sodium heart healthy    Complete by:  As directed      Discharge instructions    Complete by:  As directed   START PLAVIX ON THE DAY AFTER THE LIVER BIOPSY     Increase activity slowly    Complete by:  As directed             Medication List  TAKE these medications        amLODipine 10 MG tablet  Commonly known as:  NORVASC  Take 1 tablet (10 mg total) by mouth daily.     aspirin 81 MG EC tablet  Take 1 tablet (81 mg total) by mouth daily.     baclofen 10 MG tablet  Commonly known as:  LIORESAL  Take 1 tablet (10 mg total) by mouth 4 (four) times daily.     clopidogrel 75 MG tablet  Commonly known as:  PLAVIX  Take 1 tablet (75 mg total) by mouth daily.     feeding supplement (PRO-STAT SUGAR FREE 64) Liqd  Take 30 mLs by mouth 2 (two) times daily.     FOLIC ACID PO  Take 1 tablet by mouth daily.     lisinopril 40 MG tablet  Commonly known as:  PRINIVIL,ZESTRIL  Take 1 tablet (40 mg total)  by mouth every morning.     multivitamin with minerals Tabs tablet  Take 1 tablet by mouth daily.     pravastatin 80 MG tablet  Commonly known as:  PRAVACHOL  Take 1 tablet (80 mg total) by mouth daily.     VITAMIN B-1 PO  Take 1 tablet by mouth daily.     VITAMIN D3 PO  Take 1 tablet by mouth daily.       No Known Allergies Follow-up Information    Follow up with Xu,Jindong, MD. Schedule an appointment as soon as possible for a visit in 2 months.   Specialty:  Neurology   Why:  stroke clinic   Contact information:   [place] [id number] [phone]       Follow up with PYRTLE, Lajuan Lines, MD.   Specialty:  Gastroenterology   Why:  AS NEEDED    Contact information:   [address] [address] [phone]       Follow up with Mauricio Po, FNP In 1 week.   Specialty:  Family Medicine   Contact information:   [place] [address] [phone]        The results of significant diagnostics from this hospitalization (including imaging, microbiology, ancillary and laboratory) are listed below for reference.    Significant Diagnostic Studies: Dg Thoracic Spine W/swimmers  12/17/2014   CLINICAL DATA:  Pain along the base of the left neck.  EXAM: THORACIC SPINE - 2 VIEW + SWIMMERS  COMPARISON:  04/25/2010  FINDINGS: Atherosclerotic calcification of the aortic arch. Minimal thoracic spondylosis. No thoracic spine compression or malalignment. Appearance of the lung parenchyma suggests emphysema.  IMPRESSION: 1. Emphysema. 2. Atherosclerotic aortic arch. 3. Mild thoracic spondylosis. No thoracic malalignment or compression identified.   Electronically Signed   By: Van Clines M.D.   On: 12/17/2014 21:39   Ct Head Wo Contrast  12/17/2014   CLINICAL DATA:  Fall.  EXAM: CT HEAD WITHOUT CONTRAST  CT CERVICAL SPINE WITHOUT CONTRAST  TECHNIQUE: Multidetector CT imaging of the head and cervical spine was performed  following the standard protocol without intravenous contrast. Multiplanar CT image reconstructions of the cervical spine were also generated.  COMPARISON:  08/16/2014 head CT.  FINDINGS: CT HEAD FINDINGS  There is atrophy and chronic small vessel disease changes. Old bilateral thalamic lacunar infarcts. No acute intracranial abnormality. Specifically, no hemorrhage, hydrocephalus, mass lesion, acute infarction, or significant intracranial injury. No acute calvarial abnormality. Visualized paranasal sinuses and mastoids clear. Orbital soft tissues unremarkable.  CT CERVICAL  SPINE FINDINGS  Normal alignment. Degenerative disc disease changes diffusely with disc space narrowing and spurring. Prevertebral soft tissues are normal. No fracture. No epidural or paraspinal hematoma. Dense carotid vascular calcifications bilaterally. Small calcified and noncalcified nodules within the thyroid. Scarring noted in the apices of the lungs bilaterally with emphysematous changes.  IMPRESSION: No acute intracranial abnormality. Atrophy, chronic small vessel disease.  No acute bony abnormality in the cervical spine.   Electronically Signed   By: Rolm Baptise M.D.   On: 12/17/2014 16:08   Ct Cervical Spine Wo Contrast  12/17/2014   CLINICAL DATA:  Fall.  EXAM: CT HEAD WITHOUT CONTRAST  CT CERVICAL SPINE WITHOUT CONTRAST  TECHNIQUE: Multidetector CT imaging of the head and cervical spine was performed following the standard protocol without intravenous contrast. Multiplanar CT image reconstructions of the cervical spine were also generated.  COMPARISON:  08/16/2014 head CT.  FINDINGS: CT HEAD FINDINGS  There is atrophy and chronic small vessel disease changes. Old bilateral thalamic lacunar infarcts. No acute intracranial abnormality. Specifically, no hemorrhage, hydrocephalus, mass lesion, acute infarction, or significant intracranial injury. No acute calvarial abnormality. Visualized paranasal sinuses and mastoids clear. Orbital  soft tissues unremarkable.  CT CERVICAL SPINE FINDINGS  Normal alignment. Degenerative disc disease changes diffusely with disc space narrowing and spurring. Prevertebral soft tissues are normal. No fracture. No epidural or paraspinal hematoma. Dense carotid vascular calcifications bilaterally. Small calcified and noncalcified nodules within the thyroid. Scarring noted in the apices of the lungs bilaterally with emphysematous changes.  IMPRESSION: No acute intracranial abnormality. Atrophy, chronic small vessel disease.  No acute bony abnormality in the cervical spine.   Electronically Signed   By: Rolm Baptise M.D.   On: 12/17/2014 16:08   Mr Jodene Nam Head Wo Contrast  12/18/2014   CLINICAL DATA:  69 year old hypertensive male with small acute infarct posterior left coronal radiata. Subsequent encounter.  EXAM: MRA HEAD WITHOUT CONTRAST  TECHNIQUE: Angiographic images of the Circle of Willis were obtained using MRA technique without intravenous contrast.  COMPARISON:  12/17/2014 brain MR. 04/23/2010 MR angiogram circle Willis.  FINDINGS: Right internal carotid artery pre cavernous segment posterior bulges measuring up to 2 mm similar to prior exam. Intervening stenosis. Findings may reflect result of atherosclerotic type changes versus focal aneurysm.  Moderate narrowing junction cavernous/ petrous segment right internal carotid artery  Moderate ectasia cavernous segment right internal carotid artery.  Moderate narrowing at the junction of the pre cavernous cavernous segment left internal carotid artery.  No significant stenosis of either carotid terminus, M1 segment of the middle cerebral artery or A1 segment of the anterior cerebral artery.  Middle cerebral artery mild to slightly moderate branch vessel irregularity bilaterally.  Left vertebral artery is dominant. Moderate narrowing distal right vertebral artery.  Poor delineation of the posterior inferior cerebellar arteries.  Ectatic basilar artery without  significant stenosis.  Fetal type contribution to the right posterior cerebral artery.  Mild narrowing proximal left posterior cerebral artery.  Posterior cerebral artery distal branch vessel mild moderate irregularity bilaterally.  IMPRESSION: Progressive atherosclerotic type changes as detailed above.  The focal bulges of the right internal carotid artery pre cavernous segment may reflect result of atherosclerotic disease with aneurysm a secondary consideration. Overall appearance at this level is similar to prior exam. Please see above for further detail.   Electronically Signed   By: Genia Del M.D.   On: 12/18/2014 16:10   Mr Brain Wo Contrast  12/17/2014   CLINICAL DATA:  Initial evaluation acute  syncope with increased right-sided weakness.  EXAM: MRI HEAD WITHOUT CONTRAST  TECHNIQUE: Multiplanar, multiecho pulse sequences of the brain and surrounding structures were obtained without intravenous contrast.  COMPARISON:  Prior CT from earlier the same day.  FINDINGS: Study is moderately degraded by motion artifact.  Diffuse prominence of the CSF containing spaces is compatible with generalized cerebral atrophy. Patchy and confluent T2/FLAIR hyperintensity within the periventricular and deep white matter both cerebral hemispheres most consistent with chronic small vessel ischemic disease. Multiple remote lacunar infarcts involve the bilateral basal ganglia, specifically within the thalami. Additional small remote lacunar infarct present within the periventricular white matter of the right corona radiata.  There is a small acute ischemic infarct involving the periventricular white matter of the left corona radiata (series 9, image 22). This appears somewhat curvilinear in nature on coronal DWI sequence, measuring approximately 1 cm in length (series 10, image 14). No associated hemorrhage or significant mass effect. No other acute intracranial infarct. Normal intravascular flow voids preserved.  No mass  lesion or midline shift. No mass effect. Ventricular prominence related global parenchymal volume loss present without hydrocephalus. No extra-axial fluid collection.  Craniocervical junction within normal limits. Visualized upper cervical spine is unremarkable. Pituitary gland grossly normal.  No acute abnormality about the orbits. Sequelae of prior lens extraction noted bilaterally.  Minimal mucosal thickening present within the right maxillary sinus. Paranasal sinuses are otherwise clear. No mastoid effusion. Inner ear structures normal.  Bone marrow signal intensity within normal limits. Scalp soft tissues unremarkable.  IMPRESSION: 1. Small acute approximately 1 cm curvilinear ischemic infarct involving the periventricular white matter of the left corona radiata. No significant mass effect or associated hemorrhage. 2. No other acute intracranial process. 3. Multiple remote lacunar infarcts involving the bilateral basal ganglia as well as the periventricular white matter of the right corona radiata. 4. Generalized cerebral atrophy with advanced chronic microvascular ischemic disease.   Electronically Signed   By: Jeannine Boga M.D.   On: 12/17/2014 22:51   US Abdomen Limited  12/19/2014   CLINICAL DATA:  Patient with liver mass identified on prior echocardiography.  EXAM: US ABDOMEN LIMITED - RIGHT UPPER QUADRANT  COMPARISON:  None.  FINDINGS: Gallbladder:  The gallbladder is contracted. No gallbladder wall thickening and pericholecystic fluid. Negative sonographic Murphy sign.  Common bile duct:  Diameter: 4 mm  Liver:  Multiple isoechoic hepatic masses are demonstrated including a 6.6 x 5.5 x 7.8 cm mass and a 5.3 x 5.0 x 5.1 cm mass.  IMPRESSION: Multiple large hepatic masses which are indeterminate on ultrasound evaluation. Patient needs further evaluation with pre and post contrast-enhanced CT or MRI for definitive characterization.  Decompressed gallbladder.  These results will be called to the  ordering clinician or representative by the Radiologist Assistant, and communication documented in the PACS or zVision Dashboard.   Electronically Signed   By: Lovey Newcomer M.D.   On: 12/19/2014 16:37   Ct Abd Wo & W Cm  12/20/2014   CLINICAL DATA:  Liver masses found on ultrasound.  EXAM: CT ABDOMEN WITHOUT AND WITH CONTRAST  TECHNIQUE: Multidetector CT imaging of the abdomen was performed following the standard protocol before and following the bolus administration of intravenous contrast.  CONTRAST:  188m OMNIPAQUE IOHEXOL 300 MG/ML  SOLN  COMPARISON:  Liver ultrasound 12/19/2014  FINDINGS: BODY WALL: No contributory findings.  LOWER CHEST: No contributory findings.  ABDOMEN/PELVIS:  Liver: There are 8 hypo-enhancing masses randomly distributed throughout the liver, the largest spanning segments  2 and 3 at 8 cm. The larger have a roughly targetoid enhancement pattern. There is no indication of cirrhosis. Sub cm nonenhancing cyst noted in the right liver ventral to the inferior portal vein.  Biliary: No evidence of biliary obstruction or stone.  Pancreas: There is a hypoenhancing 2 cm mass in the ventral neck of the pancreas. No effect on the main duct. No peripancreatic adenopathy or fat infiltration.  Spleen: Unremarkable.  Adrenals: 14 mm nodule in the right adrenal gland which does not meet criteria for adenoma based on precontrast imaging. Although there are multiple phases, no 15 minute delay for definitive washout characterization.  Kidneys and ureters: Severe smooth atrophy of the right kidney, likely from high-grade renal artery stenosis.  Bowel: No obstruction or inflammation of the visualized portions.  Peritoneum: No ascites or pneumoperitoneum.  Vascular: Extensive atherosclerosis of the aorta and branch vessels. Bulky calcified atheroma at the level of the renal arteries causes moderate luminal stenosis. There is a partly visible fusiform aortic aneurysm, infrarenal, measuring up to 42 mm and  containing peripheral thrombus.  OSSEOUS: No acute abnormalities.  IMPRESSION: 1. 8 liver masses are consistent with metastases, the largest in the left liver measuring 8 cm. A 2 cm hypoenhancing pancreatic neck mass is noted and could reflect a primary pancreatic adenocarcinoma. 2. Indeterminate 14 mm right adrenal nodule. 3. 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 Atherosclerosis is extensive and chronic right renal artery stenosis accounts for severe right renal atrophy.   Electronically Signed   By: Monte Fantasia M.D.   On: 12/20/2014 06:37    Microbiology: Recent Results (from the past 240 hour(s))  Urine culture     Status: None   Collection Time: 12/18/14  6:00 AM  Result Value Ref Range Status   Specimen Description URINE, CLEAN CATCH  Final   Special Requests NONE  Final   Colony Count   Final    >=100,000 COLONIES/ML Performed at [facility]    Culture   Final    ESCHERICHIA COLI Performed at [facility]    Report Status 12/20/2014 FINAL  Final   Organism ID, Bacteria ESCHERICHIA COLI  Final      Susceptibility   Escherichia coli - MIC*    AMPICILLIN >=32 RESISTANT Resistant     CEFAZOLIN <=4 SENSITIVE Sensitive     CEFTRIAXONE <=1 SENSITIVE Sensitive     CIPROFLOXACIN <=0.25 SENSITIVE Sensitive     GENTAMICIN <=1 SENSITIVE Sensitive     LEVOFLOXACIN <=0.12 SENSITIVE Sensitive     NITROFURANTOIN <=16 SENSITIVE Sensitive     TOBRAMYCIN <=1 SENSITIVE Sensitive     TRIMETH/SULFA <=20 SENSITIVE Sensitive     PIP/TAZO <=4 SENSITIVE Sensitive     * ESCHERICHIA COLI     Labs: Basic Metabolic Panel:  Recent Labs Lab 12/17/14 1520 12/18/14 0635 12/19/14 0342 12/20/14 0620 12/21/14 0330  NA 121* 126* 125* 127* 127*  K 3.6 4.7 3.4* 3.7 3.0*  CL 83* 95* 92* 93* 91*  CO2 24 26 21* 26 26  GLUCOSE 94 82 81 88 78  BUN '6 7 9 7 12  '$ CREATININE 1.01 1.07 0.90 0.79 0.83  CALCIUM 8.5* 8.1* 8.2* 8.4* 8.4*   Liver Function  Tests:  Recent Labs Lab 12/17/14 1520 12/18/14 0635 12/21/14 0330  AST 166* 97* 35  ALT 40 30 25  ALKPHOS 96 69 75  BILITOT 1.0 0.7 0.6  PROT 7.7 5.9* 6.0*  ALBUMIN 4.2 3.1* 3.1*   No results  for input(s): LIPASE, AMYLASE in the last 168 hours. No results for input(s): AMMONIA in the last 168 hours. CBC:  Recent Labs Lab 12/17/14 1520 12/18/14 0635 12/20/14 0620  WBC 12.4* 8.6 11.2*  HGB 15.7 13.2 12.6*  HCT 43.0 37.0* 36.2*  MCV 83.8 84.7 85.0  PLT 304 241 251   Cardiac Enzymes:  Recent Labs Lab 12/17/14 1855 12/18/14 0012 12/18/14 0635 12/18/14 1642 12/19/14 0342 12/20/14 0620  CKTOTAL 3517* 2722* 1938* 1652* 1008* 483*  CKMB 61.4* 40.4* 27.5*  --   --   --   TROPONINI <0.03 <0.03 <0.03  --   --   --    BNP: BNP (last 3 results) No results for input(s): BNP in the last 8760 hours.  ProBNP (last 3 results) No results for input(s): PROBNP in the last 8760 hours.  CBG: No results for input(s): GLUCAP in the last 168 hours.     SignedDebbe Odea, MD [facility] 12/21/2014, 9:29 AM

## 2014-12-21 NOTE — Care Management Note (Signed)
Case Management Note  Patient Details  Name: Hector Rice MRN: [id number] Date of Birth: 03/09/1946  Subjective/Objective: Pt admitted for syncope and plans to dc to [facility] today IDL.                  Action/Plan: CM did cancel services for legacy and set up with [facility]. NO further needs identified.    Expected Discharge Date:                  Expected Discharge Plan:  Assisted Living / Rest Home (Pt and daughter chose IDL Facility. )  In-House Referral:  Clinical Social Work  Discharge planning Services  CM Consult  Post Acute Care Choice:    Choice offered to:     DME Arranged:    DME Agency:     HH Arranged:  PT, OT, RN [place] Agency:  [place] ([place] d/c and pt was set up with [facility]. )  Status of Service:  Completed, signed off  Medicare Important Message Given:  Yes Date Medicare IM Given:  12/21/14 Medicare IM give by:  Jacqlyn Krauss, RN, BSN  Date Additional Medicare IM Given:    Additional Medicare Important Message give by:     If discussed at [place] of Stay Meetings, dates discussed:    Additional Comments:  Bethena Roys, RN 12/21/2014, 12:18 PM

## 2014-12-21 NOTE — Progress Notes (Signed)
Pt d/c'd to [facility] with daughter via wheelchair. IV d/c'd. Pt assisted with dressing. No c/o pain. D/c instructions and medications were reviewed and given. Pt and daughter Alyse Low) both verbalized understanding. All questions answered. No belongings left in the room. Case management resolved issues with pt's home health agency of choice.

## 2014-12-22 ENCOUNTER — Observation Stay (HOSPITAL_COMMUNITY)
Admission: EM | Admit: 2014-12-22 | Discharge: 2014-12-26 | Disposition: A | Discharge status: Home / Self Care | Payer: Medicare Other | Attending: Family Medicine | Admitting: Family Medicine

## 2014-12-22 ENCOUNTER — Encounter (HOSPITAL_COMMUNITY): Payer: Self-pay | Admitting: Emergency Medicine

## 2014-12-22 DIAGNOSIS — E785 Hyperlipidemia, unspecified: Secondary | ICD-10-CM

## 2014-12-22 DIAGNOSIS — R42 Dizziness and giddiness: Secondary | ICD-10-CM | POA: Insufficient documentation

## 2014-12-22 DIAGNOSIS — R160 Hepatomegaly, not elsewhere classified: Secondary | ICD-10-CM

## 2014-12-22 DIAGNOSIS — Z79899 Other long term (current) drug therapy: Secondary | ICD-10-CM | POA: Insufficient documentation

## 2014-12-22 DIAGNOSIS — I10 Essential (primary) hypertension: Secondary | ICD-10-CM | POA: Diagnosis not present

## 2014-12-22 DIAGNOSIS — Z7982 Long term (current) use of aspirin: Secondary | ICD-10-CM | POA: Insufficient documentation

## 2014-12-22 DIAGNOSIS — I639 Cerebral infarction, unspecified: Secondary | ICD-10-CM | POA: Diagnosis present

## 2014-12-22 DIAGNOSIS — E43 Unspecified severe protein-calorie malnutrition: Secondary | ICD-10-CM | POA: Diagnosis present

## 2014-12-22 DIAGNOSIS — R0602 Shortness of breath: Secondary | ICD-10-CM | POA: Insufficient documentation

## 2014-12-22 DIAGNOSIS — Z7902 Long term (current) use of antithrombotics/antiplatelets: Secondary | ICD-10-CM | POA: Diagnosis not present

## 2014-12-22 DIAGNOSIS — Z87891 Personal history of nicotine dependence: Secondary | ICD-10-CM | POA: Insufficient documentation

## 2014-12-22 DIAGNOSIS — G8110 Spastic hemiplegia affecting unspecified side: Secondary | ICD-10-CM

## 2014-12-22 DIAGNOSIS — R55 Syncope and collapse: Secondary | ICD-10-CM | POA: Diagnosis not present

## 2014-12-22 DIAGNOSIS — G8111 Spastic hemiplegia affecting right dominant side: Secondary | ICD-10-CM | POA: Diagnosis present

## 2014-12-22 DIAGNOSIS — Z8673 Personal history of transient ischemic attack (TIA), and cerebral infarction without residual deficits: Secondary | ICD-10-CM | POA: Diagnosis not present

## 2014-12-22 DIAGNOSIS — R079 Chest pain, unspecified: Secondary | ICD-10-CM | POA: Insufficient documentation

## 2014-12-22 DIAGNOSIS — R7989 Other specified abnormal findings of blood chemistry: Secondary | ICD-10-CM

## 2014-12-22 HISTORY — DX: Reserved for inherently not codable concepts without codable children: IMO0001

## 2014-12-22 HISTORY — DX: Anxiety disorder, unspecified: F41.9

## 2014-12-22 HISTORY — DX: Myoneural disorder, unspecified: G70.9

## 2014-12-22 LAB — CBC
HCT: 35.1 % — ABNORMAL LOW (ref 39.0–52.0)
Hemoglobin: 12.6 g/dL — ABNORMAL LOW (ref 13.0–17.0)
MCH: 30.5 pg (ref 26.0–34.0)
MCHC: 35.9 g/dL (ref 30.0–36.0)
MCV: 85 fL (ref 78.0–100.0)
Platelets: 323 10*3/uL (ref 150–400)
RBC: 4.13 MIL/uL — ABNORMAL LOW (ref 4.22–5.81)
RDW: 12.1 % (ref 11.5–15.5)
WBC: 13.3 10*3/uL — ABNORMAL HIGH (ref 4.0–10.5)

## 2014-12-22 LAB — BASIC METABOLIC PANEL
ANION GAP: 12 (ref 5–15)
ANION GAP: 9 (ref 5–15)
BUN: 17 mg/dL (ref 6–20)
BUN: 19 mg/dL (ref 6–20)
CALCIUM: 8.9 mg/dL (ref 8.9–10.3)
CALCIUM: 8.9 mg/dL (ref 8.9–10.3)
CO2: 23 mmol/L (ref 22–32)
CO2: 26 mmol/L (ref 22–32)
CREATININE: 1.07 mg/dL (ref 0.61–1.24)
CREATININE: 1.11 mg/dL (ref 0.61–1.24)
Chloride: 91 mmol/L — ABNORMAL LOW (ref 101–111)
Chloride: 94 mmol/L — ABNORMAL LOW (ref 101–111)
GFR calc non Af Amer: >60 mL/min (ref >60)
GFR calc non Af Amer: >60 mL/min (ref >60)
Glucose, Bld: 114 mg/dL — ABNORMAL HIGH (ref 65–99)
Glucose, Bld: 120 mg/dL — ABNORMAL HIGH (ref 65–99)
Potassium: 3.9 mmol/L (ref 3.5–5.1)
Potassium: 3.3 mmol/L — ABNORMAL LOW (ref 3.5–5.1)
Sodium: 126 mmol/L — ABNORMAL LOW (ref 135–145)
Sodium: 129 mmol/L — ABNORMAL LOW (ref 135–145)

## 2014-12-22 LAB — I-STAT CHEM 8, ED
BUN: 22 mg/dL — ABNORMAL HIGH (ref 6–20)
CHLORIDE: 89 mmol/L — AB (ref 101–111)
Calcium, Ion: 1.1 mmol/L — ABNORMAL LOW (ref 1.13–1.30)
Creatinine, Ser: 1.1 mg/dL (ref 0.61–1.24)
Glucose, Bld: 114 mg/dL — ABNORMAL HIGH (ref 65–99)
HCT: 41 % (ref 39.0–52.0)
Hemoglobin: 13.9 g/dL (ref 13.0–17.0)
Potassium: 3.9 mmol/L (ref 3.5–5.1)
Sodium: 127 mmol/L — ABNORMAL LOW (ref 135–145)
TCO2: 23 mmol/L (ref 0–100)

## 2014-12-22 LAB — TROPONIN I: Troponin I: <0.03 ng/mL (ref <0.031)

## 2014-12-22 LAB — PHOSPHORUS: Phosphorus: 4.2 mg/dL (ref 2.5–4.6)

## 2014-12-22 LAB — MAGNESIUM: MAGNESIUM: 2 mg/dL (ref 1.7–2.4)

## 2014-12-22 LAB — D-DIMER, QUANTITATIVE (NOT AT ARMC): D-Dimer, Quant: 1.7 ug/mL-FEU — ABNORMAL HIGH (ref 0.00–0.48)

## 2014-12-22 MED ORDER — SODIUM CHLORIDE 0.9 % IV SOLN: INTRAVENOUS | Status: DC

## 2014-12-22 MED ORDER — BACLOFEN 10 MG PO TABS
10.0000 mg | ORAL_TABLET | Freq: Every day | ORAL | Status: DC | PRN
Start: 2014-12-22 — End: 2014-12-27
  Filled 2014-12-22: qty 1

## 2014-12-22 MED ORDER — HEPARIN SODIUM (PORCINE) 5000 UNIT/ML IJ SOLN
5000.0000 [IU] | Freq: Three times a day (TID) | INTRAMUSCULAR | Status: DC
  Administered 2014-12-22 – 2014-12-26 (×5): 5000 [IU] via SUBCUTANEOUS
  Filled 2014-12-22 (×12): qty 1

## 2014-12-22 MED ORDER — PRAVASTATIN SODIUM 80 MG PO TABS
80.0000 mg | ORAL_TABLET | Freq: Every day | ORAL | Status: DC
  Administered 2014-12-23 – 2014-12-26 (×4): 80 mg via ORAL
  Filled 2014-12-22 (×4): qty 1

## 2014-12-22 MED ORDER — HYDRALAZINE HCL 20 MG/ML IJ SOLN: 5.0000 mg | Freq: Four times a day (QID) | INTRAMUSCULAR | Status: DC | PRN

## 2014-12-22 MED ORDER — ADULT MULTIVITAMIN W/MINERALS CH
1.0000 | ORAL_TABLET | Freq: Every day | ORAL | Status: DC
Start: 2014-12-23 — End: 2014-12-27
  Administered 2014-12-23 – 2014-12-26 (×4): 1 via ORAL
  Filled 2014-12-22 (×4): qty 1

## 2014-12-22 MED ORDER — SODIUM CHLORIDE 0.9 % IJ SOLN
3.0000 mL | Freq: Two times a day (BID) | INTRAMUSCULAR | Status: DC
  Administered 2014-12-22 – 2014-12-25 (×4): 3 mL via INTRAVENOUS

## 2014-12-22 MED ORDER — SODIUM CHLORIDE 0.9 % IV SOLN
INTRAVENOUS | Status: DC
  Administered 2014-12-23: 04:00:00 via INTRAVENOUS

## 2014-12-22 MED ORDER — CLOPIDOGREL BISULFATE 75 MG PO TABS
75.0000 mg | ORAL_TABLET | Freq: Every day | ORAL | Status: DC
  Administered 2014-12-23 – 2014-12-24 (×2): 75 mg via ORAL
  Filled 2014-12-22 (×3): qty 1

## 2014-12-22 MED ORDER — ASPIRIN EC 81 MG PO TBEC
81.0000 mg | DELAYED_RELEASE_TABLET | Freq: Every day | ORAL | Status: DC
  Administered 2014-12-23 – 2014-12-26 (×3): 81 mg via ORAL
  Filled 2014-12-22 (×4): qty 1

## 2014-12-22 NOTE — ED Notes (Signed)
Per EMS: pt from [facility] for rehab from previous stroke which pt was discharged for yesterday. Caregiver with pt states that pt c/o dizziness and had syncopal episode while in wheelchair, pt denies falling out of wheelchair. Caregiver also reports intermittent confusion following syncopal episode, per Daughter-these are same symptoms as last week. EMS noted no new neuro deficits. Pt has severe right sided weakness from previous stroke, pt and caregiver state that pt has no speech changes. nad noted. Pt denies any pain or new complaints at this time.

## 2014-12-22 NOTE — Progress Notes (Signed)
Admission note:  Arrival Method: Patient arrived in stretcher with daughter and staff accompanying. Mental Orientation:Alert and oriented x 4. Telemetry: 6E-30, NSR. Assessment: See Doc flow sheets. Skin: Assessed the skin with 2 nd nurse Tina, redness on the bottom and on the right elbow noted, foam dressing in place.  Dry scabs noted on the legs.   IV: Left AC, patent and infusing NS 175 ml/hr. Pain: Denies any pain. Tubes: Condom cath. Safety Measures: Bed in low position, bed alarm, phone and call light within reach on the left side as he is having right sided weakness. Fall Prevention Safety Plan:  Reviewed the plan, understood and acknowledged.  Admission Screening: In progress. 6700 Orientation: Patient has been oriented to the unit, staff and to the room.

## 2014-12-22 NOTE — ED Provider Notes (Signed)
CSN: [id number]     Arrival date & time 12/22/14  1334 History   First MD Initiated Contact with Patient 12/22/14 1341     Chief Complaint  Patient presents with  . Loss of Consciousness     (Consider location/radiation/quality/duration/timing/severity/associated sxs/prior Treatment) Patient is a 69 y.o. male presenting with syncope.  Loss of Consciousness Episode history:  Multiple Most recent episode: once last night and once today. Duration: brief. Timing:  Intermittent Progression:  Resolved Chronicity:  Recurrent Context comment:  Yesterday when trying to transfer, today while sitting in a car. Relieved by:  Nothing Associated symptoms: chest pain, dizziness and shortness of breath   Associated symptoms: no fever, no nausea and no vomiting     Past Medical History  Diagnosis Date  . Hypertension   . Memory loss   . Stroke 2011    Right sided hemiparesis   Past Surgical History  Procedure Laterality Date  . Hand surgery     Family History  Problem Relation Age of Onset  . Alzheimer's disease Mother 17  . Heart failure Father 29  . Immunocompromised Sister   . COPD Sister   . Migraines Daughter   . Immunocompromised Daughter   . Stroke Maternal Grandmother    History  Substance Use Topics  . Smoking status: Former Smoker    Quit date: 04/26/2010  . Smokeless tobacco: Never Used  . Alcohol Use: 16.8 oz/week    28 Cans of beer per week     Comment: Pt drinks daily    Review of Systems  Constitutional: Negative for fever.  Respiratory: Positive for shortness of breath.   Cardiovascular: Positive for chest pain and syncope.  Gastrointestinal: Negative for nausea and vomiting.  Neurological: Positive for dizziness.  All other systems reviewed and are negative.     Allergies  Review of patient's allergies indicates no known allergies.  Home Medications   Prior to Admission medications   Medication Sig Start Date End Date Taking? Authorizing  Provider  Amino Acids-Protein Hydrolys (FEEDING SUPPLEMENT, PRO-STAT SUGAR FREE 64,) LIQD Take 30 mLs by mouth 2 (two) times daily. 12/21/14   Debbe Odea, MD  amLODipine (NORVASC) 10 MG tablet Take 1 tablet (10 mg total) by mouth daily. 11/14/14   Golden Circle, FNP  aspirin EC 81 MG EC tablet Take 1 tablet (81 mg total) by mouth daily. 12/21/14   Debbe Odea, MD  baclofen (LIORESAL) 10 MG tablet Take 1 tablet (10 mg total) by mouth 4 (four) times daily. Patient taking differently: Take 10 mg by mouth daily as needed for muscle spasms.  09/27/14   Pieter Partridge, DO  Cholecalciferol (VITAMIN D3 PO) Take 1 tablet by mouth daily.    Historical Provider, MD  clopidogrel (PLAVIX) 75 MG tablet Take 1 tablet (75 mg total) by mouth daily. 12/21/14   Debbe Odea, MD  FOLIC ACID PO Take 1 tablet by mouth daily.    Historical Provider, MD  lisinopril (PRINIVIL,ZESTRIL) 40 MG tablet Take 1 tablet (40 mg total) by mouth every morning. 11/14/14   Golden Circle, FNP  Multiple Vitamin (MULTIVITAMIN WITH MINERALS) TABS tablet Take 1 tablet by mouth daily. 12/21/14   Debbe Odea, MD  pravastatin (PRAVACHOL) 80 MG tablet Take 1 tablet (80 mg total) by mouth daily. 11/14/14   Golden Circle, FNP  Thiamine HCl (VITAMIN B-1 PO) Take 1 tablet by mouth daily.    Historical Provider, MD   BP 145/95 mmHg  Pulse 91  Resp 16  Wt 122 lb (55.339 kg)  SpO2 100% Physical Exam  Constitutional: He is oriented to person, place, and time. He appears well-developed and well-nourished. No distress.  HENT:  Head: Normocephalic and atraumatic.  Mouth/Throat: Oropharynx is clear and moist.  Eyes: Conjunctivae are normal. Pupils are equal, round, and reactive to light. No scleral icterus.  Neck: Neck supple.  Cardiovascular: Normal rate, regular rhythm, normal heart sounds and intact distal pulses.   No murmur heard. Pulmonary/Chest: Effort normal and breath sounds normal. No stridor. No respiratory distress. He has no  wheezes. He has no rales.  Abdominal: Soft. He exhibits no distension. There is no tenderness. There is no rebound and no guarding.  Musculoskeletal: Normal range of motion. He exhibits no edema.  Neurological: He is alert and oriented to person, place, and time.  Skin: Skin is warm and dry. No rash noted.  Psychiatric: He has a normal mood and affect. His behavior is normal.  Nursing note and vitals reviewed.   ED Course  Procedures (including critical care time) Labs Review Labs Reviewed  CBC - Abnormal; Notable for the following:    WBC 13.3 (*)    RBC 4.13 (*)    Hemoglobin 12.6 (*)    HCT 35.1 (*)    All other components within normal limits  BASIC METABOLIC PANEL - Abnormal; Notable for the following:    Sodium 126 (*)    Chloride 91 (*)    Glucose, Bld 114 (*)    All other components within normal limits  I-STAT CHEM 8, ED - Abnormal; Notable for the following:    Sodium 127 (*)    Chloride 89 (*)    BUN 22 (*)    Glucose, Bld 114 (*)    Calcium, Ion 1.10 (*)    All other components within normal limits  TROPONIN I  CBG MONITORING, ED    Imaging Review No results found.   EKG Interpretation   Date/Time:  Friday Dec 22 2014 13:54:35 EDT Ventricular Rate:  86 PR Interval:  44 QRS Duration: 91 QT Interval:  414 QTC Calculation: 495 R Axis:   -62 Text Interpretation:  Sinus rhythm Left axis deviation Probable  anteroseptal infarct, old Artifact in lead(s) I II aVR aVL aVF V1 V2 V3 V4  V5 V6 No significant change was found Confirmed by [facility]  MD, TREY  ([id number]) on 12/22/2014 3:41:15 PM      MDM   Final diagnoses:  Syncope, unspecified syncope type    69 yo male with syncope. He reports passing out once yesterday and once today.  Had brief episodes of CP and SOB at time of syncope. Nontoxic appearance without current symptomatic complaint.  Labs similar to prior.  Due to recurrent syncope, will need admission for obs.     Serita Grit, MD 12/22/14  [phone]

## 2014-12-22 NOTE — H&P (Signed)
[facility] History and Physical  Chuong Casebeer WUJ:[id number] DOB: Jul 15, 1946 DOA: 12/22/2014  Referring physician: ED physician PCP: Mauricio Po, FNP   Chief Complaint: Syncopal episode  HPI: Hector Rice is a 69 y.o. 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 The patient states that he does not remember but was told that while he was being wheeled out to the grocery store he passed out. There was no reports of any seizure-like activity. He denies any incontinence or tongue biting. He did stay that he had some left-sided chest discomfort otherwise he denies any other symptoms. Currently he denies any chest discomfort and ED workup showed a negative troponin.  Given recurrent syncopal episode we were consulted for further evaluation recommendations.   Review of Systems:  Constitutional:  No weight loss, night sweats, Fevers, chills, fatigue.  HEENT:  No headaches, Difficulty swallowing,Tooth/dental problems,Sore throat,  No sneezing, itching, ear ache, nasal congestion, post nasal drip,  Cardio-vascular:  No chest pain, Orthopnea, PND, swelling in lower extremities, anasarca,palpitations  GI:  No heartburn, indigestion, abdominal pain, nausea, vomiting, diarrhea, change in bowel habits, loss of appetite  Resp:  No shortness of breath with exertion or at rest. No excess mucus, no productive cough, No non-productive cough, No coughing up of blood.No change in color of mucus.No wheezing.No chest wall deformity  Skin:  no rash or lesions.  GU:  no dysuria, change in color of urine, no urgency or frequency. No flank pain.  Musculoskeletal:  No joint pain or swelling. No decreased range of motion. No back pain.  Psych:  No change  in mood or affect. No depression or anxiety. No memory loss.   Past Medical History  Diagnosis Date  . Hypertension   . Memory loss   . Stroke 2011    Right sided hemiparesis   Past Surgical History  Procedure Laterality Date  . Hand surgery     Social History:  reports that he quit smoking about 4 years ago. He has never used smokeless tobacco. He reports that he drinks about 16.8 oz of alcohol per week. He reports that he does not use illicit drugs.  No Known Allergies  Family History  Problem Relation Age of Onset  . Alzheimer's disease Mother 59  . Heart failure Father 74  . Immunocompromised Sister   . COPD Sister   . Migraines Daughter   . Immunocompromised Daughter   . Stroke Maternal Grandmother     Prior to Admission medications   Medication Sig Start Date End Date Taking? Authorizing Provider  Amino Acids-Protein Hydrolys (FEEDING SUPPLEMENT, PRO-STAT SUGAR FREE 64,) LIQD Take 30 mLs by mouth 2 (two) times daily. 12/21/14  Yes Debbe Odea, MD  amLODipine (NORVASC) 10 MG tablet Take 1 tablet (10 mg total) by mouth daily. 11/14/14  Yes Golden Circle, FNP  aspirin EC 81 MG EC tablet Take 1 tablet (81 mg total) by mouth daily. 12/21/14  Yes Debbe Odea, MD  baclofen (LIORESAL) 10 MG tablet Take 1 tablet (10 mg total) by mouth 4 (four) times daily. Patient taking differently: Take 10 mg by mouth daily as needed for muscle spasms.  09/27/14  Yes Pieter Partridge, DO  Cholecalciferol (VITAMIN D3 PO) Take 1 tablet by mouth daily.   Yes Historical Provider, MD  clopidogrel (PLAVIX) 75  MG tablet Take 1 tablet (75 mg total) by mouth daily. 12/21/14  Yes Debbe Odea, MD  FOLIC ACID PO Take 1 tablet by mouth daily.   Yes Historical Provider, MD  lisinopril (PRINIVIL,ZESTRIL) 40 MG tablet Take 1 tablet (40 mg total) by mouth every morning. 11/14/14  Yes Golden Circle, FNP  MELATONIN PO Take 1 tablet by mouth at bedtime.   Yes Historical Provider, MD  Multiple Vitamin (MULTIVITAMIN  WITH MINERALS) TABS tablet Take 1 tablet by mouth daily. 12/21/14  Yes Debbe Odea, MD  pravastatin (PRAVACHOL) 80 MG tablet Take 1 tablet (80 mg total) by mouth daily. 11/14/14  Yes Golden Circle, FNP  Thiamine HCl (VITAMIN B-1 PO) Take 1 tablet by mouth daily.   Yes Historical Provider, MD   Physical Exam: Filed Vitals:   12/22/14 1500 12/22/14 1515 12/22/14 1545 12/22/14 1639  BP: 131/69 125/73 133/70 147/75  Pulse: 90 85 86 94  Temp:    97.6 F (36.4 C)  TempSrc:    Oral  Resp: '17 15 14 17  '$ Weight:      SpO2: 100% 100% 98% 100%    Wt Readings from Last 3 Encounters:  12/22/14 55.339 kg (122 lb)  11/14/14 56.7 kg (125 lb)  09/27/14 56.972 kg (125 lb 9.6 oz)    General:  Appears calm and comfortable Eyes: PERRL, normal lids, irises & conjunctiva ENT: grossly normal hearing, lips & tongue, dry mucous membranes Neck: no LAD, masses or thyromegaly Cardiovascular: RRR, no m/r/g. No LE edema. Respiratory: CTA bilaterally, no w/r/r. Normal respiratory effort. Abdomen: soft, nt, nd Skin: no rash or induration seen on limited exam Musculoskeletal: Right-sided hemiparesis Psychiatric: grossly normal mood and affect, speech fluent and appropriate Neurologic: Right-sided hemiparesis, answers questions appropriately          Labs on Admission:  Basic Metabolic Panel:  Recent Labs Lab 12/18/14 0635 12/19/14 0342 12/20/14 0620 12/21/14 0330 12/22/14 1352 12/22/14 1400  NA 126* 125* 127* 127* 126* 127*  K 4.7 3.4* 3.7 3.0* 3.9 3.9  CL 95* 92* 93* 91* 91* 89*  CO2 26 21* '26 26 23  '$ --   GLUCOSE 82 81 88 78 114* 114*  BUN '7 9 7 12 17 '$ 22*  CREATININE 1.07 0.90 0.79 0.83 1.07 1.10  CALCIUM 8.1* 8.2* 8.4* 8.4* 8.9  --    Liver Function Tests:  Recent Labs Lab 12/17/14 1520 12/18/14 0635 12/21/14 0330  AST 166* 97* 35  ALT 40 30 25  ALKPHOS 96 69 75  BILITOT 1.0 0.7 0.6  PROT 7.7 5.9* 6.0*  ALBUMIN 4.2 3.1* 3.1*   No results for input(s): LIPASE, AMYLASE in the  last 168 hours. No results for input(s): AMMONIA in the last 168 hours. CBC:  Recent Labs Lab 12/17/14 1520 12/18/14 0635 12/20/14 0620 12/22/14 1352 12/22/14 1400  WBC 12.4* 8.6 11.2* 13.3*  --   HGB 15.7 13.2 12.6* 12.6* 13.9  HCT 43.0 37.0* 36.2* 35.1* 41.0  MCV 83.8 84.7 85.0 85.0  --   PLT 304 241 251 323  --    Cardiac Enzymes:  Recent Labs Lab 12/17/14 1855 12/18/14 0012 12/18/14 0635 12/18/14 1642 12/19/14 0342 12/20/14 0620 12/22/14 1352  CKTOTAL 3517* 2722* 1938* 1652* 1008* 483*  --   CKMB 61.4* 40.4* 27.5*  --   --   --   --   TROPONINI <0.03 <0.03 <0.03  --   --   --  <0.03    BNP (last 3 results) No  results for input(s): BNP in the last 8760 hours.  ProBNP (last 3 results) No results for input(s): PROBNP in the last 8760 hours.  CBG: No results for input(s): GLUCAP in the last 168 hours.  Radiological Exams on Admission: No results found.   Assessment/Plan Principal Problem:   Syncope - Given new masses I would like to rule out PE. Will obtain d-dimer. If d-dimer elevated we'll recommend obtaining CT angiogram of chest - Telemetry monitoring - Orthostatic vital signs - If workup negative may have to consider allowing higher blood pressures - EEG negative on prior admission which was a few days ago, carotid Dopplers obtained and results reviewed no obstructions.   Active Problems:   Essential hypertension - We'll hold blood pressure medication but will place when necessary hydralazine orders    Hyperlipidemia - Stable no chest pain currently troponin negative continue statin    Right spastic hemiparesis - Continue anti-spasmodic    CVA (cerebral infarction) - Continue home medication regimen - Please see blood pressure discussion above    Protein-calorie malnutrition, severe - Will liberalize diet  Hyponatremia - We'll obtain BMP every 6 hours. I suspect this is to poor oral solute intake. We'll administer normal saline and  reassess    Liver masses - Patient to continue workup as outpatient after discharge    Code Status: Full DVT Prophylaxis: Heparin Family Communication: Discussed directly with patient Disposition Plan: Pending further workup  Time spent: > 55 minutes  Velvet Bathe [facility] Pager [phone]

## 2014-12-23 ENCOUNTER — Observation Stay (HOSPITAL_COMMUNITY): Payer: Medicare Other

## 2014-12-23 DIAGNOSIS — E785 Hyperlipidemia, unspecified: Secondary | ICD-10-CM | POA: Diagnosis not present

## 2014-12-23 DIAGNOSIS — E876 Hypokalemia: Secondary | ICD-10-CM

## 2014-12-23 DIAGNOSIS — R160 Hepatomegaly, not elsewhere classified: Secondary | ICD-10-CM | POA: Diagnosis not present

## 2014-12-23 DIAGNOSIS — R55 Syncope and collapse: Secondary | ICD-10-CM | POA: Diagnosis not present

## 2014-12-23 LAB — BASIC METABOLIC PANEL
ANION GAP: 10 (ref 5–15)
Anion gap: 8 (ref 5–15)
Anion gap: 11 (ref 5–15)
BUN: 17 mg/dL (ref 6–20)
BUN: 12 mg/dL (ref 6–20)
BUN: 13 mg/dL (ref 6–20)
CALCIUM: 8.3 mg/dL — AB (ref 8.9–10.3)
CO2: 27 mmol/L (ref 22–32)
CO2: 25 mmol/L (ref 22–32)
CO2: 25 mmol/L (ref 22–32)
CREATININE: 1.13 mg/dL (ref 0.61–1.24)
CREATININE: 0.91 mg/dL (ref 0.61–1.24)
Calcium: 8.3 mg/dL — ABNORMAL LOW (ref 8.9–10.3)
Calcium: 8.3 mg/dL — ABNORMAL LOW (ref 8.9–10.3)
Chloride: 95 mmol/L — ABNORMAL LOW (ref 101–111)
Chloride: 96 mmol/L — ABNORMAL LOW (ref 101–111)
Chloride: 92 mmol/L — ABNORMAL LOW (ref 101–111)
Creatinine, Ser: 1.23 mg/dL (ref 0.61–1.24)
GFR calc Af Amer: >60 mL/min (ref >60)
GFR calc Af Amer: >60 mL/min (ref >60)
GFR calc non Af Amer: >60 mL/min (ref >60)
GFR calc non Af Amer: 58 mL/min — ABNORMAL LOW (ref >60)
GLUCOSE: 173 mg/dL — AB (ref 65–99)
Glucose, Bld: 92 mg/dL (ref 65–99)
Glucose, Bld: 82 mg/dL (ref 65–99)
POTASSIUM: 3 mmol/L — AB (ref 3.5–5.1)
Potassium: 3.6 mmol/L (ref 3.5–5.1)
Potassium: 3.3 mmol/L — ABNORMAL LOW (ref 3.5–5.1)
SODIUM: 130 mmol/L — AB (ref 135–145)
Sodium: 131 mmol/L — ABNORMAL LOW (ref 135–145)
Sodium: 128 mmol/L — ABNORMAL LOW (ref 135–145)

## 2014-12-23 MED ORDER — IOHEXOL 350 MG/ML SOLN
75.0000 mL | Freq: Once | INTRAVENOUS | Status: AC | PRN
  Administered 2014-12-23: 75 mL via INTRAVENOUS

## 2014-12-23 MED ORDER — POTASSIUM CHLORIDE CRYS ER 20 MEQ PO TBCR
40.0000 meq | EXTENDED_RELEASE_TABLET | Freq: Once | ORAL | Status: AC
  Administered 2014-12-23: 40 meq via ORAL
  Filled 2014-12-23: qty 2

## 2014-12-23 NOTE — Progress Notes (Signed)
Message sent to Dr. Wendee Beavers via text page re: pt's daughter Genevie Cheshire [phone]) wants to speak with MD concerning brining in hospice.

## 2014-12-23 NOTE — Evaluation (Signed)
Physical Therapy Evaluation Patient Details Name: Hector Rice MRN: [id number] DOB: 09/17/1945 Today's Date: 12/23/2014   History of Present Illness  Patient is a 69 year old male with prior history of CVA with right-sided hemiparesis, hypertension, syncope with MRI revealed small acute infarct involving left corona radiata 5/24 with D/C to [name] and return to [name] 5/27 due to additional syncope  Clinical Impression  Pt very pleasant and eager to get OOB. Pt states he has utilized DME to walk for 5 years and uses RW even though unable with RUE. Pt from [name] and plans to return. Pt will benefit from acute therapy to maximize strength, gait and mobility to decrease burden of care and increase independence.     Follow Up Recommendations Home health PT;Supervision/Assistance - 24 hour;Other (comment) ([name])    Equipment Recommendations  None recommended by PT    Recommendations for Other Services       Precautions / Restrictions Precautions Precautions: Fall      Mobility  Bed Mobility Overal bed mobility: Needs Assistance Bed Mobility: Rolling;Sidelying to Sit Rolling: Modified independent (Device/Increase time) Sidelying to sit: Modified independent (Device/Increase time);Min assist       General bed mobility comments: pt utilizing rail to roll right, and rise from surface with assist to fully achieve upright and maintain sitting posture, pt able to recover balance after 5 sec sitting  Transfers Overall transfer level: Needs assistance   Transfers: Sit to/from Stand;Stand Pivot Transfers Sit to Stand: Min assist Stand pivot transfers: Mod assist       General transfer comment: cues for sequence with assist to weight shift and cues for RLE placement  Ambulation/Gait                Stairs            Wheelchair Mobility    Modified Rankin (Stroke Patients Only)       Balance Overall balance assessment: Needs  assistance Sitting-balance support: Feet supported Sitting balance-Leahy Scale: Fair       Standing balance-Leahy Scale: Poor                               Pertinent Vitals/Pain Pain Assessment: No/denies pain    Home Living Family/patient expects to be discharged to:: Assisted living     Type of Home: House Home Access: Ramped entrance     Home Layout: One level Home Equipment: Walker - 2 wheels;Wheelchair - power;Tub bench;Grab bars - toilet      Prior Function Level of Independence: Independent with assistive device(s)         Comments: prior to last admission pt was caring for himself at home, using power chair. D/C to SNF with max assist for ADLs and mod assist to transfer     Hand Dominance        Extremity/Trunk Assessment   Upper Extremity Assessment: RUE deficits/detail RUE Deficits / Details: residual right hemiparesis; increased tone   RUE Sensation: decreased light touch     Lower Extremity Assessment: RLE deficits/detail RLE Deficits / Details: moves in synergy patterns. Able to extends but resting position is flexion at all joints. (right side weakness from CVA 5 years ago       Communication   Communication: No difficulties  Cognition Arousal/Alertness: Awake/alert Behavior During Therapy: WFL for tasks assessed/performed Overall Cognitive Status: Impaired/Different from baseline Area of Impairment: Awareness  Awareness: Intellectual   General Comments: pt with condom cath off, incontinent and unaware    General Comments      Exercises        Assessment/Plan    PT Assessment Patient needs continued PT services  PT Diagnosis Difficulty walking;Generalized weakness   PT Problem List Decreased strength;Decreased activity tolerance;Decreased balance;Decreased mobility;Decreased safety awareness;Decreased cognition;Decreased range of motion;Decreased coordination  PT Treatment Interventions DME  instruction;Gait training;Functional mobility training;Therapeutic activities;Therapeutic exercise;Patient/family education;Balance training   PT Goals (Current goals can be found in the Care Plan section) Acute Rehab PT Goals Patient Stated Goal: be able to walk PT Goal Formulation: With patient Time For Goal Achievement: 01/06/15 Potential to Achieve Goals: Good    Frequency Min 3X/week   Barriers to discharge Decreased caregiver support      Co-evaluation               End of Session Equipment Utilized During Treatment: Gait belt Activity Tolerance: Patient tolerated treatment well Patient left: in chair;with call bell/phone within reach;with chair alarm set;with family/visitor present Nurse Communication: Mobility status    Functional Assessment Tool Used: clinical judgement Functional Limitation: Mobility: Walking and moving around Mobility: Walking and Moving Around Current Status [phone]): At least 20 percent but less than 40 percent impaired, limited or restricted Mobility: Walking and Moving Around Goal Status [phone]): At least 1 percent but less than 20 percent impaired, limited or restricted    Time: 1047-1105 PT Time Calculation (min) (ACUTE ONLY): 18 min   Charges:   PT Evaluation $Initial PT Evaluation Tier I: 1 Procedure     PT G Codes:   PT G-Codes **NOT FOR INPATIENT CLASS** Functional Assessment Tool Used: clinical judgement Functional Limitation: Mobility: Walking and moving around Mobility: Walking and Moving Around Current Status ([id number]): At least 20 percent but less than 40 percent impaired, limited or restricted Mobility: Walking and Moving Around Goal Status [phone]): At least 1 percent but less than 20 percent impaired, limited or restricted    Melford Aase 12/23/2014, 12:44 PM  Elwyn Reach, [place]

## 2014-12-23 NOTE — Progress Notes (Signed)
Genevie Cheshire, patient's daughter arrived on unit.  Left message with on call social worker that patient's daughter is here and that MD had told her to have [place] call social work when she arrived.

## 2014-12-23 NOTE — Progress Notes (Signed)
Result of d-dimer text paged to T. Callahan,NP. Order received. Hector Rice, Hector Rice, Therapist, sports

## 2014-12-23 NOTE — Progress Notes (Signed)
[facility] PROGRESS NOTE  Hector Rice YTK:[id number] DOB: 01-20-1946 DOA: 12/22/2014 PCP: Hector Po, FNP  Assessment/Plan: Principal Problem:  Syncope - CT angiogram negative for PE but showing new lung mass (discussed with patient) - Telemetry monitoring: no red flags reported to me. - If workup negative may have to consider allowing higher blood pressures - EEG negative on prior admission which was a few days ago, carotid Dopplers obtained and results reviewed reported no obstructions.  - most likely due to poor oral intake with concomitant blood pressure medication  Active Problems:  Essential hypertension - We'll hold blood pressure medication but will place when necessary hydralazine orders   Hyperlipidemia - Stable no chest pain currently troponin negative continue statin   Right spastic hemiparesis - Continue anti-spasmodic   CVA (cerebral infarction) - Continue home medication regimen - Please see blood pressure discussion above   Protein-calorie malnutrition, severe - Will liberalize diet  Hypokalemia -Replace and reassess next a.m. New problem  Hyponatremia - We'll obtain BMP every 6 hours. I suspect this is to poor oral solute intake. We'll administer normal saline and reassess - Improving with normal saline and improved oral intake. Since patient is eating will discontinue IV fluids   Liver masses - Patient to continue workup as outpatient after discharge  Code Status: Full Family Communication: Discussed with patient and his family members at bedside (indicate person spoken with, relationship, and if by phone, the number) Disposition Plan: With continued improvement in condition most likely discharge in the next one or 2 days   Consultants:  None  Procedures:  none  Antibiotics:  None  HPI/Subjective: Pt has no new complaints. No acute issues reported overnight.  Objective: Filed Vitals:   12/23/14 0527  BP: 144/67   Pulse: 80  Temp: 98.3 F (36.8 C)  Resp: 18    Intake/Output Summary (Last 24 hours) at 12/23/14 1439 Last data filed at 12/23/14 1400  Gross per 24 hour  Intake   1895 ml  Output   1050 ml  Net    845 ml   Filed Weights   12/22/14 1346 12/22/14 2040  Weight: 55.339 kg (122 lb) 55 kg (121 lb 4.1 oz)    Exam:   General:  Pt in nad, alert and awake  Cardiovascular: rrr, no mrg  Respiratory: no increased wob, no wheezes  Abdomen: soft, NT, Nd   Musculoskeletal: R hemiparesis   Data Reviewed: Basic Metabolic Panel:  Recent Labs Lab 12/21/14 0330 12/22/14 1352 12/22/14 1400 12/22/14 2016 12/23/14 0133 12/23/14 0821  NA 127* 126* 127* 129* 130* 131*  K 3.0* 3.9 3.9 3.3* 3.6 3.3*  CL 91* 91* 89* 94* 95* 96*  CO2 26 23  --  '26 27 25  '$ GLUCOSE 78 114* 114* 120* 92 82  BUN 12 17 22* '19 17 12  '$ CREATININE 0.83 1.07 1.10 1.11 1.13 0.91  CALCIUM 8.4* 8.9  --  8.9 8.3* 8.3*  MG  --   --   --  2.0  --   --   PHOS  --   --   --  4.2  --   --    Liver Function Tests:  Recent Labs Lab 12/17/14 1520 12/18/14 0635 12/21/14 0330  AST 166* 97* 35  ALT 40 30 25  ALKPHOS 96 69 75  BILITOT 1.0 0.7 0.6  PROT 7.7 5.9* 6.0*  ALBUMIN 4.2 3.1* 3.1*   No results for input(s): LIPASE, AMYLASE in the last 168 hours. No results for  input(s): AMMONIA in the last 168 hours. CBC:  Recent Labs Lab 12/17/14 1520 12/18/14 0635 12/20/14 0620 12/22/14 1352 12/22/14 1400  WBC 12.4* 8.6 11.2* 13.3*  --   HGB 15.7 13.2 12.6* 12.6* 13.9  HCT 43.0 37.0* 36.2* 35.1* 41.0  MCV 83.8 84.7 85.0 85.0  --   PLT 304 241 251 323  --    Cardiac Enzymes:  Recent Labs Lab 12/17/14 1855 12/18/14 0012 12/18/14 0635 12/18/14 1642 12/19/14 0342 12/20/14 0620 12/22/14 1352  CKTOTAL 3517* 2722* 1938* 1652* 1008* 483*  --   CKMB 61.4* 40.4* 27.5*  --   --   --   --   TROPONINI <0.03 <0.03 <0.03  --   --   --  <0.03   BNP (last 3 results) No results for input(s): BNP in the last 8760  hours.  ProBNP (last 3 results) No results for input(s): PROBNP in the last 8760 hours.  CBG: No results for input(s): GLUCAP in the last 168 hours.  Recent Results (from the past 240 hour(s))  Urine culture     Status: None   Collection Time: 12/18/14  6:00 AM  Result Value Ref Range Status   Specimen Description URINE, CLEAN CATCH  Final   Special Requests NONE  Final   Colony Count   Final    >=100,000 COLONIES/ML Performed at [facility]    Culture   Final    ESCHERICHIA COLI Performed at [facility]    Report Status 12/20/2014 FINAL  Final   Organism ID, Bacteria ESCHERICHIA COLI  Final      Susceptibility   Escherichia coli - MIC*    AMPICILLIN >=32 RESISTANT Resistant     CEFAZOLIN <=4 SENSITIVE Sensitive     CEFTRIAXONE <=1 SENSITIVE Sensitive     CIPROFLOXACIN <=0.25 SENSITIVE Sensitive     GENTAMICIN <=1 SENSITIVE Sensitive     LEVOFLOXACIN <=0.12 SENSITIVE Sensitive     NITROFURANTOIN <=16 SENSITIVE Sensitive     TOBRAMYCIN <=1 SENSITIVE Sensitive     TRIMETH/SULFA <=20 SENSITIVE Sensitive     PIP/TAZO <=4 SENSITIVE Sensitive     * ESCHERICHIA COLI     Studies: Ct Angio Chest Pe W/cm &/or Wo Cm  12/23/2014   CLINICAL DATA:  Chest discomfort and elevated D-dimer  EXAM: CT ANGIOGRAPHY CHEST WITH CONTRAST  TECHNIQUE: Multidetector CT imaging of the chest was performed using the standard protocol during bolus administration of intravenous contrast. Multiplanar CT image reconstructions and MIPs were obtained to evaluate the vascular anatomy.  CONTRAST:  19m OMNIPAQUE IOHEXOL 350 MG/ML SOLN  COMPARISON:  None.  FINDINGS: THORACIC INLET/BODY WALL:  No acute abnormality.  MEDIASTINUM:  Normal heart size. No pericardial effusion. No evidence of pulmonary embolism or aortic dissection. No definite adenopathy.  LUNG WINDOWS:  2 cm lobulated nodule at the right apex which was not seen on CTA of the neck 04/23/2010. Other biapical opacities are stable and  consistent with scar. Centrilobular emphysema.  UPPER ABDOMEN:  Liver, right adrenal and pancreatic neck masses -known from recent abdominal CT.  OSSEOUS:  Remote left scapula inferior angle fracture with callus. No acute fracture. No suspicious lytic or blastic lesions.  Review of the MIP images confirms the above findings.  IMPRESSION: 1. Negative for pulmonary embolism or other acute finding. 2. 2 cm right apical lung nodule, new from 2011 and highly suspicious for bronchogenic carcinoma in this patient with emphysema. This could be synchronous with the intra-abdominal metastatic disease and depending on  results of planned liver biopsy, PET-CT may be useful.   Electronically Signed   By: Monte Fantasia M.D.   On: 12/23/2014 04:54    Scheduled Meds: . aspirin EC  81 mg Oral Daily  . clopidogrel  75 mg Oral Q breakfast  . heparin  5,000 Units Subcutaneous 3 times per day  . multivitamin with minerals  1 tablet Oral Daily  . pravastatin  80 mg Oral q1800  . sodium chloride  3 mL Intravenous Q12H   Continuous Infusions: . sodium chloride 75 mL/hr at 12/23/14 0346    Time spent: > 35 minutes    Velvet Bathe  [facility] Pager [phone] If 7PM-7AM, please contact night-coverage at [email or web address], password [facility] 12/23/2014, 2:39 PM

## 2014-12-24 DIAGNOSIS — I10 Essential (primary) hypertension: Secondary | ICD-10-CM | POA: Diagnosis not present

## 2014-12-24 DIAGNOSIS — R55 Syncope and collapse: Secondary | ICD-10-CM | POA: Diagnosis not present

## 2014-12-24 DIAGNOSIS — R160 Hepatomegaly, not elsewhere classified: Secondary | ICD-10-CM | POA: Diagnosis not present

## 2014-12-24 LAB — BASIC METABOLIC PANEL
ANION GAP: 9 (ref 5–15)
BUN: 11 mg/dL (ref 6–20)
CHLORIDE: 96 mmol/L — AB (ref 101–111)
CO2: 24 mmol/L (ref 22–32)
Calcium: 8.4 mg/dL — ABNORMAL LOW (ref 8.9–10.3)
Creatinine, Ser: 1.01 mg/dL (ref 0.61–1.24)
GFR calc Af Amer: >60 mL/min (ref >60)
GFR calc non Af Amer: >60 mL/min (ref >60)
Glucose, Bld: 88 mg/dL (ref 65–99)
POTASSIUM: 3.5 mmol/L (ref 3.5–5.1)
Sodium: 129 mmol/L — ABNORMAL LOW (ref 135–145)

## 2014-12-24 NOTE — Clinical Social Work Note (Signed)
Clinical Social Work Assessment  Patient Details  Name: Hector Rice MRN: [id number] Date of Birth: May 13, 1946  Date of referral:  12/24/14               Reason for consult:  Facility Placement, Discharge Planning                Permission sought to share information with:  Family Supports Permission granted to share information::  Yes, Verbal Permission Granted  Name::     Genevie Cheshire  Agency::     Relationship::  Child, Daughter  Contact Information:  [phone]  Housing/Transportation Living arrangements for the past 2 months:  [place] [employer]) Source of Information:  Adult Children Patient Interpreter Needed:  None Criminal Activity/Legal Involvement Pertinent to Current Situation/Hospitalization:  No - Comment as needed Significant Relationships:  Adult Children Lives with:  Facility Resident, Self Do you feel safe going back to the place where you live?  No (Daughter concerned patient unable to care for self at [name].) Need for family participation in patient care:  Yes (Comment) (Patient's daughter active in patient's care.)  Care giving concerns:  Patient and patient's daughter stated concern regarding patient's ability to care for self at [name]. Patient and patient's daughter requesting SNF placement with palliative services following.   Social Worker assessment / plan:  CSW received call from patient's daughter requesting assistance with discharge planning. Per patient's daughter, patient from [name] but is unable to return at time of discharge. Patient and patient's daughter requesting SNF placement at time of discharge. Patient's daughter stated preference for [facility] or [facility]. CSW updated medical team regarding change in discharge disposition. CSW to continue to follow and assist with discharge planning needs.  Employment status:  Retired Forensic scientist:  Medicare PT Recommendations:  Home with [place] / Referral to  community resources:  [place]  Patient/Family's Response to care:  Patient and patient's daughter understanding and agreeable to CSW plan of care.  Patient/Family's Understanding of and Emotional Response to Diagnosis, Current Treatment, and Prognosis:  Patient and patient's daughter understanding and agreeable to CSW plan of care.  Emotional Assessment Appearance:  Other (Comment Required (CSW spoke with patient's daughter.) Attitude/Demeanor/Rapport:  Other (CSW spoke with patient's daughter.) Affect (typically observed):  Other (CSW spoke with patient's daughter.) Orientation:  Oriented to Self, Oriented to Place, Oriented to  Time, Oriented to Situation Alcohol / Substance use:  Not Applicable Psych involvement (Current and /or in the community):  No (Comment) (Not appropriate on this admission.)  Discharge Needs  Concerns to be addressed:  No discharge needs identified Readmission within the last 30 days:  Yes Current discharge risk:  None Barriers to Discharge:  No Barriers Identified   Caroline Sauger, LCSW 12/24/2014, 10:00 AM [phone]

## 2014-12-24 NOTE — Discharge Summary (Addendum)
Physician Discharge Summary  Hector Rice LPF:[id number] DOB: 1946-04-12 DOA: 12/22/2014  PCP: Mauricio Po, FNP  Admit date: 12/22/2014 Discharge date: 12/26/2014  Time spent: > 35 minutes  Recommendations for Outpatient Follow-up:  1. Encourage po intake 2. Patient will need work up for liver, pancrease, and lung masses (biopsy planned on January 01, 2015 at 1 pm) discussed with patient and daughter. 3. Patient has had several episodes of syncope and I suspect this is secondary to poor oral intake and combination of antihypertensives. I have discontinued antihypertensives. Please monitor patient's blood pressures and if blood pressures continue to be persistently elevated would consider continuing at lower doses his blood pressure medication. 4. Addendum: Patient had several findings on CT scans reported below suspicious for metastatic disease including right apical lung mass. Will need further work up and as such I have set up biopsy and indicated that patient hold plavix 5 days prior to procedure.  Discharge Diagnoses:  Principal Problem:   Syncope Active Problems:   Essential hypertension   Hyperlipidemia   Right spastic hemiparesis   CVA (cerebral infarction)   Protein-calorie malnutrition, severe   Liver masses   Discharge Condition: full  Diet recommendation: regular   Filed Weights   12/24/14 0555 12/24/14 2150 12/25/14 2048  Weight: 56.5 kg (124 lb 9 oz) 57.2 kg (126 lb 1.7 oz) 55.7 kg (122 lb 12.7 oz)    History of present illness:  From original HPI: 69 y.o. 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  Hospital Course:  Syncope - Most likely due to continued poor oral intake  - Liberalized diet - Patient CT angiogram  of chest negative for PE - No red flags reported on telemetry - Will d/c lisinopril and amlodipine, may be resumed if patient has persistently elevated blood pressures. At this juncture patient is at increased risk of incomplete be while on his blood pressures I suspect secondary to poor oral intake and concomitant  use of anti-hypertensive medications - Patient to transition to SNF for further physical therapy  For other known comorbidities we'll continue home medication regimen  Procedures:  CT Angiogram of chest  Consultations:  None  Discharge Exam: Filed Vitals:   12/26/14 0515  BP: 137/69  Pulse: 80  Temp: 98.1 F (36.7 C)  Resp: 18    General: Pt in nad, alert and awake Cardiovascular: rrr, no mrg Respiratory: cta bl, no wheezes  Discharge Instructions   Discharge Instructions    Call MD for:  difficulty breathing, headache or visual disturbances    Complete by:  As directed      Call MD for:  temperature >100.4    Complete by:  As directed      Diet - low sodium heart healthy    Complete by:  As directed      Discharge instructions    Complete by:  As directed   Please have your sodium reassessed within the next week  Follow-up on 01/01/2015 at 1 PM [place] for liver biopsy. Be there at 10:30 AM with driver and make sure that you do not consume anything after 7 AM that morning     Increase activity slowly    Complete by:  As directed           Current Discharge Medication List  CONTINUE these medications which have CHANGED   Details  clopidogrel (PLAVIX) 75 MG tablet Take 1 tablet (75 mg total) by mouth daily. Qty: 30 tablet, Refills: 0      CONTINUE these medications which have NOT CHANGED   Details  Amino Acids-Protein Hydrolys (FEEDING SUPPLEMENT, PRO-STAT SUGAR FREE 64,) LIQD Take 30 mLs by mouth 2 (two) times daily. Qty: 900 mL, Refills: 0    aspirin EC 81 MG EC tablet Take 1 tablet (81 mg total) by mouth daily.    baclofen (LIORESAL)  10 MG tablet Take 1 tablet (10 mg total) by mouth 4 (four) times daily. Qty: 120 each, Refills: 3    Cholecalciferol (VITAMIN D3 PO) Take 1 tablet by mouth daily.    FOLIC ACID PO Take 1 tablet by mouth daily.    MELATONIN PO Take 1 tablet by mouth at bedtime.    Multiple Vitamin (MULTIVITAMIN WITH MINERALS) TABS tablet Take 1 tablet by mouth daily.    pravastatin (PRAVACHOL) 80 MG tablet Take 1 tablet (80 mg total) by mouth daily. Qty: 90 tablet, Refills: 0    Thiamine HCl (VITAMIN B-1 PO) Take 1 tablet by mouth daily.      STOP taking these medications     amLODipine (NORVASC) 10 MG tablet      lisinopril (PRINIVIL,ZESTRIL) 40 MG tablet        No Known Allergies    The results of significant diagnostics from this hospitalization (including imaging, microbiology, ancillary and laboratory) are listed below for reference.    Significant Diagnostic Studies: Dg Thoracic Spine W/swimmers  12/17/2014   CLINICAL DATA:  Pain along the base of the left neck.  EXAM: THORACIC SPINE - 2 VIEW + SWIMMERS  COMPARISON:  04/25/2010  FINDINGS: Atherosclerotic calcification of the aortic arch. Minimal thoracic spondylosis. No thoracic spine compression or malalignment. Appearance of the lung parenchyma suggests emphysema.  IMPRESSION: 1. Emphysema. 2. Atherosclerotic aortic arch. 3. Mild thoracic spondylosis. No thoracic malalignment or compression identified.   Electronically Signed   By: Van Clines M.D.   On: 12/17/2014 21:39   Ct Head Wo Contrast  12/17/2014   CLINICAL DATA:  Fall.  EXAM: CT HEAD WITHOUT CONTRAST  CT CERVICAL SPINE WITHOUT CONTRAST  TECHNIQUE: Multidetector CT imaging of the head and cervical spine was performed following the standard protocol without intravenous contrast. Multiplanar CT image reconstructions of the cervical spine were also generated.  COMPARISON:  08/16/2014 head CT.  FINDINGS: CT HEAD FINDINGS  There is atrophy and chronic small vessel disease changes.  Old bilateral thalamic lacunar infarcts. No acute intracranial abnormality. Specifically, no hemorrhage, hydrocephalus, mass lesion, acute infarction, or significant intracranial injury. No acute calvarial abnormality. Visualized paranasal sinuses and mastoids clear. Orbital soft tissues unremarkable.  CT CERVICAL SPINE FINDINGS  Normal alignment. Degenerative disc disease changes diffusely with disc space narrowing and spurring. Prevertebral soft tissues are normal. No fracture. No epidural or paraspinal hematoma. Dense carotid vascular calcifications bilaterally. Small calcified and noncalcified nodules within the thyroid. Scarring noted in the apices of the lungs bilaterally with emphysematous changes.  IMPRESSION: No acute intracranial abnormality. Atrophy, chronic small vessel disease.  No acute bony abnormality in the cervical spine.   Electronically Signed   By: Rolm Baptise M.D.   On: 12/17/2014 16:08   Ct Angio Chest Pe W/cm &/or Wo Cm  12/23/2014   CLINICAL DATA:  Chest discomfort and elevated D-dimer  EXAM: CT ANGIOGRAPHY CHEST WITH CONTRAST  TECHNIQUE: Multidetector CT imaging of the  chest was performed using the standard protocol during bolus administration of intravenous contrast. Multiplanar CT image reconstructions and MIPs were obtained to evaluate the vascular anatomy.  CONTRAST:  12m OMNIPAQUE IOHEXOL 350 MG/ML SOLN  COMPARISON:  None.  FINDINGS: THORACIC INLET/BODY WALL:  No acute abnormality.  MEDIASTINUM:  Normal heart size. No pericardial effusion. No evidence of pulmonary embolism or aortic dissection. No definite adenopathy.  LUNG WINDOWS:  2 cm lobulated nodule at the right apex which was not seen on CTA of the neck 04/23/2010. Other biapical opacities are stable and consistent with scar. Centrilobular emphysema.  UPPER ABDOMEN:  Liver, right adrenal and pancreatic neck masses -known from recent abdominal CT.  OSSEOUS:  Remote left scapula inferior angle fracture with callus. No acute  fracture. No suspicious lytic or blastic lesions.  Review of the MIP images confirms the above findings.  IMPRESSION: 1. Negative for pulmonary embolism or other acute finding. 2. 2 cm right apical lung nodule, new from 2011 and highly suspicious for bronchogenic carcinoma in this patient with emphysema. This could be synchronous with the intra-abdominal metastatic disease and depending on results of planned liver biopsy, PET-CT may be useful.   Electronically Signed   By: JMonte FantasiaM.D.   On: 12/23/2014 04:54   Ct Cervical Spine Wo Contrast  12/17/2014   CLINICAL DATA:  Fall.  EXAM: CT HEAD WITHOUT CONTRAST  CT CERVICAL SPINE WITHOUT CONTRAST  TECHNIQUE: Multidetector CT imaging of the head and cervical spine was performed following the standard protocol without intravenous contrast. Multiplanar CT image reconstructions of the cervical spine were also generated.  COMPARISON:  08/16/2014 head CT.  FINDINGS: CT HEAD FINDINGS  There is atrophy and chronic small vessel disease changes. Old bilateral thalamic lacunar infarcts. No acute intracranial abnormality. Specifically, no hemorrhage, hydrocephalus, mass lesion, acute infarction, or significant intracranial injury. No acute calvarial abnormality. Visualized paranasal sinuses and mastoids clear. Orbital soft tissues unremarkable.  CT CERVICAL SPINE FINDINGS  Normal alignment. Degenerative disc disease changes diffusely with disc space narrowing and spurring. Prevertebral soft tissues are normal. No fracture. No epidural or paraspinal hematoma. Dense carotid vascular calcifications bilaterally. Small calcified and noncalcified nodules within the thyroid. Scarring noted in the apices of the lungs bilaterally with emphysematous changes.  IMPRESSION: No acute intracranial abnormality. Atrophy, chronic small vessel disease.  No acute bony abnormality in the cervical spine.   Electronically Signed   By: KRolm BaptiseM.D.   On: 12/17/2014 16:08   Mr MJodene NamHead Wo  Contrast  12/18/2014   CLINICAL DATA:  69year old hypertensive male with small acute infarct posterior left coronal radiata. Subsequent encounter.  EXAM: MRA HEAD WITHOUT CONTRAST  TECHNIQUE: Angiographic images of the Circle of Willis were obtained using MRA technique without intravenous contrast.  COMPARISON:  12/17/2014 brain MR. 04/23/2010 MR angiogram circle Willis.  FINDINGS: Right internal carotid artery pre cavernous segment posterior bulges measuring up to 2 mm similar to prior exam. Intervening stenosis. Findings may reflect result of atherosclerotic type changes versus focal aneurysm.  Moderate narrowing junction cavernous/ petrous segment right internal carotid artery  Moderate ectasia cavernous segment right internal carotid artery.  Moderate narrowing at the junction of the pre cavernous cavernous segment left internal carotid artery.  No significant stenosis of either carotid terminus, M1 segment of the middle cerebral artery or A1 segment of the anterior cerebral artery.  Middle cerebral artery mild to slightly moderate branch vessel irregularity bilaterally.  Left vertebral artery is dominant. Moderate narrowing distal right vertebral  artery.  Poor delineation of the posterior inferior cerebellar arteries.  Ectatic basilar artery without significant stenosis.  Fetal type contribution to the right posterior cerebral artery.  Mild narrowing proximal left posterior cerebral artery.  Posterior cerebral artery distal branch vessel mild moderate irregularity bilaterally.  IMPRESSION: Progressive atherosclerotic type changes as detailed above.  The focal bulges of the right internal carotid artery pre cavernous segment may reflect result of atherosclerotic disease with aneurysm a secondary consideration. Overall appearance at this level is similar to prior exam. Please see above for further detail.   Electronically Signed   By: Genia Del M.D.   On: 12/18/2014 16:10   Mr Brain Wo  Contrast  12/17/2014   CLINICAL DATA:  Initial evaluation acute syncope with increased right-sided weakness.  EXAM: MRI HEAD WITHOUT CONTRAST  TECHNIQUE: Multiplanar, multiecho pulse sequences of the brain and surrounding structures were obtained without intravenous contrast.  COMPARISON:  Prior CT from earlier the same day.  FINDINGS: Study is moderately degraded by motion artifact.  Diffuse prominence of the CSF containing spaces is compatible with generalized cerebral atrophy. Patchy and confluent T2/FLAIR hyperintensity within the periventricular and deep white matter both cerebral hemispheres most consistent with chronic small vessel ischemic disease. Multiple remote lacunar infarcts involve the bilateral basal ganglia, specifically within the thalami. Additional small remote lacunar infarct present within the periventricular white matter of the right corona radiata.  There is a small acute ischemic infarct involving the periventricular white matter of the left corona radiata (series 9, image 22). This appears somewhat curvilinear in nature on coronal DWI sequence, measuring approximately 1 cm in length (series 10, image 14). No associated hemorrhage or significant mass effect. No other acute intracranial infarct. Normal intravascular flow voids preserved.  No mass lesion or midline shift. No mass effect. Ventricular prominence related global parenchymal volume loss present without hydrocephalus. No extra-axial fluid collection.  Craniocervical junction within normal limits. Visualized upper cervical spine is unremarkable. Pituitary gland grossly normal.  No acute abnormality about the orbits. Sequelae of prior lens extraction noted bilaterally.  Minimal mucosal thickening present within the right maxillary sinus. Paranasal sinuses are otherwise clear. No mastoid effusion. Inner ear structures normal.  Bone marrow signal intensity within normal limits. Scalp soft tissues unremarkable.  IMPRESSION: 1. Small  acute approximately 1 cm curvilinear ischemic infarct involving the periventricular white matter of the left corona radiata. No significant mass effect or associated hemorrhage. 2. No other acute intracranial process. 3. Multiple remote lacunar infarcts involving the bilateral basal ganglia as well as the periventricular white matter of the right corona radiata. 4. Generalized cerebral atrophy with advanced chronic microvascular ischemic disease.   Electronically Signed   By: Jeannine Boga M.D.   On: 12/17/2014 22:51   US Abdomen Limited  12/19/2014   CLINICAL DATA:  Patient with liver mass identified on prior echocardiography.  EXAM: US ABDOMEN LIMITED - RIGHT UPPER QUADRANT  COMPARISON:  None.  FINDINGS: Gallbladder:  The gallbladder is contracted. No gallbladder wall thickening and pericholecystic fluid. Negative sonographic Murphy sign.  Common bile duct:  Diameter: 4 mm  Liver:  Multiple isoechoic hepatic masses are demonstrated including a 6.6 x 5.5 x 7.8 cm mass and a 5.3 x 5.0 x 5.1 cm mass.  IMPRESSION: Multiple large hepatic masses which are indeterminate on ultrasound evaluation. Patient needs further evaluation with pre and post contrast-enhanced CT or MRI for definitive characterization.  Decompressed gallbladder.  These results will be called to the ordering clinician or representative  by the Radiologist Assistant, and communication documented in the PACS or zVision Dashboard.   Electronically Signed   By: Lovey Newcomer M.D.   On: 12/19/2014 16:37   Ct Abd Wo & W Cm  12/20/2014   CLINICAL DATA:  Liver masses found on ultrasound.  EXAM: CT ABDOMEN WITHOUT AND WITH CONTRAST  TECHNIQUE: Multidetector CT imaging of the abdomen was performed following the standard protocol before and following the bolus administration of intravenous contrast.  CONTRAST:  185m OMNIPAQUE IOHEXOL 300 MG/ML  SOLN  COMPARISON:  Liver ultrasound 12/19/2014  FINDINGS: BODY WALL: No contributory findings.  LOWER CHEST:  No contributory findings.  ABDOMEN/PELVIS:  Liver: There are 8 hypo-enhancing masses randomly distributed throughout the liver, the largest spanning segments 2 and 3 at 8 cm. The larger have a roughly targetoid enhancement pattern. There is no indication of cirrhosis. Sub cm nonenhancing cyst noted in the right liver ventral to the inferior portal vein.  Biliary: No evidence of biliary obstruction or stone.  Pancreas: There is a hypoenhancing 2 cm mass in the ventral neck of the pancreas. No effect on the main duct. No peripancreatic adenopathy or fat infiltration.  Spleen: Unremarkable.  Adrenals: 14 mm nodule in the right adrenal gland which does not meet criteria for adenoma based on precontrast imaging. Although there are multiple phases, no 15 minute delay for definitive washout characterization.  Kidneys and ureters: Severe smooth atrophy of the right kidney, likely from high-grade renal artery stenosis.  Bowel: No obstruction or inflammation of the visualized portions.  Peritoneum: No ascites or pneumoperitoneum.  Vascular: Extensive atherosclerosis of the aorta and branch vessels. Bulky calcified atheroma at the level of the renal arteries causes moderate luminal stenosis. There is a partly visible fusiform aortic aneurysm, infrarenal, measuring up to 42 mm and containing peripheral thrombus.  OSSEOUS: No acute abnormalities.  IMPRESSION: 1. 8 liver masses are consistent with metastases, the largest in the left liver measuring 8 cm. A 2 cm hypoenhancing pancreatic neck mass is noted and could reflect a primary pancreatic adenocarcinoma. 2. Indeterminate 14 mm right adrenal nodule. 3. 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 Atherosclerosis is extensive and chronic right renal artery stenosis accounts for severe right renal atrophy.   Electronically Signed   By: JMonte FantasiaM.D.   On: 12/20/2014 06:37    Microbiology: Recent Results (from the past 240 hour(s))  Urine culture      Status: None   Collection Time: 12/18/14  6:00 AM  Result Value Ref Range Status   Specimen Description URINE, CLEAN CATCH  Final   Special Requests NONE  Final   Colony Count   Final    >=100,000 COLONIES/ML Performed at [facility]   Culture   Final    ESCHERICHIA COLI Performed at [facility]   Report Status 12/20/2014 FINAL  Final   Organism ID, Bacteria ESCHERICHIA COLI  Final      Susceptibility   Escherichia coli - MIC*    AMPICILLIN >=32 RESISTANT Resistant     CEFAZOLIN <=4 SENSITIVE Sensitive     CEFTRIAXONE <=1 SENSITIVE Sensitive     CIPROFLOXACIN <=0.25 SENSITIVE Sensitive     GENTAMICIN <=1 SENSITIVE Sensitive     LEVOFLOXACIN <=0.12 SENSITIVE Sensitive     NITROFURANTOIN <=16 SENSITIVE Sensitive     TOBRAMYCIN <=1 SENSITIVE Sensitive     TRIMETH/SULFA <=20 SENSITIVE Sensitive     PIP/TAZO <=4 SENSITIVE Sensitive     * ESCHERICHIA COLI  Labs: Basic Metabolic Panel:  Recent Labs Lab 12/22/14 2016 12/23/14 0133 12/23/14 0821 12/23/14 1404 12/24/14 0700 12/24/14 2325  NA 129* 130* 131* 128* 129* 127*  K 3.3* 3.6 3.3* 3.0* 3.5 3.4*  CL 94* 95* 96* 92* 96* 91*  CO2 '26 27 25 25 24 26  '$ GLUCOSE 120* 92 82 173* 88 85  BUN '19 17 12 13 11 11  '$ CREATININE 1.11 1.13 0.91 1.23 1.01 1.09  CALCIUM 8.9 8.3* 8.3* 8.3* 8.4* 8.2*  MG 2.0  --   --   --   --   --   PHOS 4.2  --   --   --   --   --    Liver Function Tests:  Recent Labs Lab 12/21/14 0330  AST 35  ALT 25  ALKPHOS 75  BILITOT 0.6  PROT 6.0*  ALBUMIN 3.1*   No results for input(s): LIPASE, AMYLASE in the last 168 hours. No results for input(s): AMMONIA in the last 168 hours. CBC:  Recent Labs Lab 12/20/14 0620 12/22/14 1352 12/22/14 1400  WBC 11.2* 13.3*  --   HGB 12.6* 12.6* 13.9  HCT 36.2* 35.1* 41.0  MCV 85.0 85.0  --   PLT 251 323  --    Cardiac Enzymes:  Recent Labs Lab 12/20/14 0620 12/22/14 1352  CKTOTAL 483*  --   TROPONINI  --  <0.03   BNP: BNP  (last 3 results) No results for input(s): BNP in the last 8760 hours.  ProBNP (last 3 results) No results for input(s): PROBNP in the last 8760 hours.  CBG: No results for input(s): GLUCAP in the last 168 hours.     Signed:  Velvet Bathe  [facility] 12/26/2014, 9:22 AM

## 2014-12-24 NOTE — Progress Notes (Signed)
Left message with on call social worker to call patient's daughter Genevie Cheshire.

## 2014-12-24 NOTE — Progress Notes (Signed)
[facility] PROGRESS NOTE  Hector Rice PVX:[id number] DOB: July 02, 1946 DOA: 12/22/2014 PCP: Mauricio Po, FNP  Assessment/Plan: Principal Problem:  Syncope - CT angiogram negative for PE but showing new lung mass (discussed with patient) - Telemetry monitoring: no red flags reported to me. - If workup negative may have to consider allowing higher blood pressures - EEG negative on prior admission which was a few days ago, carotid Dopplers obtained and results reviewed reported no obstructions.  - most likely due to poor oral intake with concomitant blood pressure medication - Agree with placement into SNF  Active Problems:  Essential hypertension - We'll hold blood pressure medication but will place when necessary hydralazine orders - considering d/c on lower dose of amlodipine   Hyperlipidemia - Stable no chest pain currently troponin negative continue statin   Right spastic hemiparesis - Continue anti-spasmodic   CVA (cerebral infarction) - Continue home medication regimen - Please see blood pressure discussion above   Protein-calorie malnutrition, severe - Will liberalize diet  Hypokalemia - Resolved on oral replacement.  Hyponatremia - We'll obtain BMP next am. I suspect this is to poor oral solute intake.    Liver masses - Patient to continue workup as outpatient after discharge  Code Status: Full Family Communication: Discussed with patient and his family members at bedside (indicate person spoken with, relationship, and if by phone, the number) Disposition Plan: With continued improvement in condition most likely discharge in the next one or 2 days, d/c planning started   Consultants:  None  Procedures:  none  Antibiotics:  None  HPI/Subjective: Pt has no new complaints. No acute issues reported overnight.  Objective: Filed Vitals:   12/24/14 0555  BP: 153/72  Pulse: 86  Temp: 97.6 F (36.4 C)  Resp: 15    Intake/Output  Summary (Last 24 hours) at 12/24/14 0956 Last data filed at 12/24/14 0630  Gross per 24 hour  Intake   1080 ml  Output   1200 ml  Net   -120 ml   Filed Weights   12/22/14 1346 12/22/14 2040 12/24/14 0555  Weight: 55.339 kg (122 lb) 55 kg (121 lb 4.1 oz) 56.5 kg (124 lb 9 oz)    Exam:   General:  Pt in nad, alert and awake  Cardiovascular: rrr, no mrg  Respiratory: no increased wob, no wheezes  Abdomen: soft, NT, Nd   Musculoskeletal: R hemiparesis   Data Reviewed: Basic Metabolic Panel:  Recent Labs Lab 12/22/14 2016 12/23/14 0133 12/23/14 0821 12/23/14 1404 12/24/14 0700  NA 129* 130* 131* 128* 129*  K 3.3* 3.6 3.3* 3.0* 3.5  CL 94* 95* 96* 92* 96*  CO2 '26 27 25 25 24  '$ GLUCOSE 120* 92 82 173* 88  BUN '19 17 12 13 11  '$ CREATININE 1.11 1.13 0.91 1.23 1.01  CALCIUM 8.9 8.3* 8.3* 8.3* 8.4*  MG 2.0  --   --   --   --   PHOS 4.2  --   --   --   --    Liver Function Tests:  Recent Labs Lab 12/17/14 1520 12/18/14 0635 12/21/14 0330  AST 166* 97* 35  ALT 40 30 25  ALKPHOS 96 69 75  BILITOT 1.0 0.7 0.6  PROT 7.7 5.9* 6.0*  ALBUMIN 4.2 3.1* 3.1*   No results for input(s): LIPASE, AMYLASE in the last 168 hours. No results for input(s): AMMONIA in the last 168 hours. CBC:  Recent Labs Lab 12/17/14 1520 12/18/14 0635 12/20/14 0620 12/22/14 1352 12/22/14  1400  WBC 12.4* 8.6 11.2* 13.3*  --   HGB 15.7 13.2 12.6* 12.6* 13.9  HCT 43.0 37.0* 36.2* 35.1* 41.0  MCV 83.8 84.7 85.0 85.0  --   PLT 304 241 251 323  --    Cardiac Enzymes:  Recent Labs Lab 12/17/14 1855 12/18/14 0012 12/18/14 0635 12/18/14 1642 12/19/14 0342 12/20/14 0620 12/22/14 1352  CKTOTAL 3517* 2722* 1938* 1652* 1008* 483*  --   CKMB 61.4* 40.4* 27.5*  --   --   --   --   TROPONINI <0.03 <0.03 <0.03  --   --   --  <0.03   BNP (last 3 results) No results for input(s): BNP in the last 8760 hours.  ProBNP (last 3 results) No results for input(s): PROBNP in the last 8760  hours.  CBG: No results for input(s): GLUCAP in the last 168 hours.  Recent Results (from the past 240 hour(s))  Urine culture     Status: None   Collection Time: 12/18/14  6:00 AM  Result Value Ref Range Status   Specimen Description URINE, CLEAN CATCH  Final   Special Requests NONE  Final   Colony Count   Final    >=100,000 COLONIES/ML Performed at [facility]    Culture   Final    ESCHERICHIA COLI Performed at [facility]    Report Status 12/20/2014 FINAL  Final   Organism ID, Bacteria ESCHERICHIA COLI  Final      Susceptibility   Escherichia coli - MIC*    AMPICILLIN >=32 RESISTANT Resistant     CEFAZOLIN <=4 SENSITIVE Sensitive     CEFTRIAXONE <=1 SENSITIVE Sensitive     CIPROFLOXACIN <=0.25 SENSITIVE Sensitive     GENTAMICIN <=1 SENSITIVE Sensitive     LEVOFLOXACIN <=0.12 SENSITIVE Sensitive     NITROFURANTOIN <=16 SENSITIVE Sensitive     TOBRAMYCIN <=1 SENSITIVE Sensitive     TRIMETH/SULFA <=20 SENSITIVE Sensitive     PIP/TAZO <=4 SENSITIVE Sensitive     * ESCHERICHIA COLI     Studies: Ct Angio Chest Pe W/cm &/or Wo Cm  12/23/2014   CLINICAL DATA:  Chest discomfort and elevated D-dimer  EXAM: CT ANGIOGRAPHY CHEST WITH CONTRAST  TECHNIQUE: Multidetector CT imaging of the chest was performed using the standard protocol during bolus administration of intravenous contrast. Multiplanar CT image reconstructions and MIPs were obtained to evaluate the vascular anatomy.  CONTRAST:  25m OMNIPAQUE IOHEXOL 350 MG/ML SOLN  COMPARISON:  None.  FINDINGS: THORACIC INLET/BODY WALL:  No acute abnormality.  MEDIASTINUM:  Normal heart size. No pericardial effusion. No evidence of pulmonary embolism or aortic dissection. No definite adenopathy.  LUNG WINDOWS:  2 cm lobulated nodule at the right apex which was not seen on CTA of the neck 04/23/2010. Other biapical opacities are stable and consistent with scar. Centrilobular emphysema.  UPPER ABDOMEN:  Liver, right adrenal  and pancreatic neck masses -known from recent abdominal CT.  OSSEOUS:  Remote left scapula inferior angle fracture with callus. No acute fracture. No suspicious lytic or blastic lesions.  Review of the MIP images confirms the above findings.  IMPRESSION: 1. Negative for pulmonary embolism or other acute finding. 2. 2 cm right apical lung nodule, new from 2011 and highly suspicious for bronchogenic carcinoma in this patient with emphysema. This could be synchronous with the intra-abdominal metastatic disease and depending on results of planned liver biopsy, PET-CT may be useful.   Electronically Signed   By: JNeva SeatD.  On: 12/23/2014 04:54    Scheduled Meds: . aspirin EC  81 mg Oral Daily  . clopidogrel  75 mg Oral Q breakfast  . heparin  5,000 Units Subcutaneous 3 times per day  . multivitamin with minerals  1 tablet Oral Daily  . pravastatin  80 mg Oral q1800  . sodium chloride  3 mL Intravenous Q12H   Continuous Infusions:    Time spent: > 35 minutes    Velvet Bathe  [facility] Pager [phone] If 7PM-7AM, please contact night-coverage at [email or web address], password [facility] 12/24/2014, 9:56 AM

## 2014-12-25 DIAGNOSIS — E43 Unspecified severe protein-calorie malnutrition: Secondary | ICD-10-CM | POA: Diagnosis not present

## 2014-12-25 DIAGNOSIS — R55 Syncope and collapse: Secondary | ICD-10-CM | POA: Diagnosis not present

## 2014-12-25 DIAGNOSIS — E785 Hyperlipidemia, unspecified: Secondary | ICD-10-CM | POA: Diagnosis not present

## 2014-12-25 DIAGNOSIS — I10 Essential (primary) hypertension: Secondary | ICD-10-CM | POA: Diagnosis not present

## 2014-12-25 LAB — BASIC METABOLIC PANEL
Anion gap: 10 (ref 5–15)
BUN: 11 mg/dL (ref 6–20)
CHLORIDE: 91 mmol/L — AB (ref 101–111)
CO2: 26 mmol/L (ref 22–32)
Calcium: 8.2 mg/dL — ABNORMAL LOW (ref 8.9–10.3)
Creatinine, Ser: 1.09 mg/dL (ref 0.61–1.24)
GFR calc Af Amer: >60 mL/min (ref >60)
GFR calc non Af Amer: >60 mL/min (ref >60)
Glucose, Bld: 85 mg/dL (ref 65–99)
POTASSIUM: 3.4 mmol/L — AB (ref 3.5–5.1)
Sodium: 127 mmol/L — ABNORMAL LOW (ref 135–145)

## 2014-12-25 MED ORDER — SODIUM CHLORIDE 1 G PO TABS
1.0000 g | ORAL_TABLET | Freq: Three times a day (TID) | ORAL | Status: DC
  Administered 2014-12-25 – 2014-12-26 (×4): 1 g via ORAL
  Filled 2014-12-25 (×5): qty 1

## 2014-12-25 NOTE — Progress Notes (Signed)
[facility] PROGRESS NOTE  Hector Rice GUY:[id number] DOB: [birth year] DOA: 12/22/2014 PCP: Mauricio Po, FNP  Assessment/Plan: Principal Problem:  Syncope - CT angiogram negative for PE but showing new lung mass (discussed with patient) - Telemetry monitoring: no red flags reported to me. - If workup negative may have to consider allowing higher blood pressures - EEG negative on prior admission which was a few days ago, carotid Dopplers obtained and results reviewed reported no obstructions.  - most likely due to poor oral intake with concomitant blood pressure medication - Agree with placement into SNF  Active Problems:  Essential hypertension - We'll hold blood pressure medication but will place when necessary hydralazine orders - considering d/c on lower dose of amlodipine   Hyperlipidemia - Stable no chest pain currently troponin negative continue statin   Right spastic hemiparesis - Continue anti-spasmodic   CVA (cerebral infarction) - Continue home medication regimen - Please see blood pressure discussion above   Protein-calorie malnutrition, severe - Will liberalize diet  Hypokalemia - Replace again today - reassess next am.  Hyponatremia - We'll obtain BMP next am. I suspect this is to poor oral solute intake.    Liver masses - Patient to continue workup as outpatient after discharge  Code Status: Full Family Communication: Discussed with patient and his family members at bedside (indicate person spoken with, relationship, and if by phone, the number) Disposition Plan: D/c next am. Will need to call radiology to reschedule biopsy  Consultants:  None  Procedures:  none  Antibiotics:  None  HPI/Subjective:  No acute issues reported overnight.  Objective: Filed Vitals:   12/25/14 0951  BP: 136/72  Pulse: 78  Temp: 97.6 F (36.4 C)  Resp: 16    Intake/Output Summary (Last 24 hours) at 12/25/14 1514 Last data filed at 12/25/14  1123  Gross per 24 hour  Intake    960 ml  Output   2401 ml  Net  -1441 ml   Filed Weights   12/22/14 2040 12/24/14 0555 12/24/14 2150  Weight: 55 kg (121 lb 4.1 oz) 56.5 kg (124 lb 9 oz) 57.2 kg (126 lb 1.7 oz)    Exam:   General:  Pt in nad, alert and awake  Cardiovascular: rrr, no mrg  Respiratory: no increased wob, no wheezes  Abdomen: soft, NT, Nd   Musculoskeletal: R hemiparesis   Data Reviewed: Basic Metabolic Panel:  Recent Labs Lab 12/22/14 2016 12/23/14 0133 12/23/14 0821 12/23/14 1404 12/24/14 0700 12/24/14 2325  NA 129* 130* 131* 128* 129* 127*  K 3.3* 3.6 3.3* 3.0* 3.5 3.4*  CL 94* 95* 96* 92* 96* 91*  CO2 '26 27 25 25 24 26  '$ GLUCOSE 120* 92 82 173* 88 85  BUN '19 17 12 13 11 11  '$ CREATININE 1.11 1.13 0.91 1.23 1.01 1.09  CALCIUM 8.9 8.3* 8.3* 8.3* 8.4* 8.2*  MG 2.0  --   --   --   --   --   PHOS 4.2  --   --   --   --   --    Liver Function Tests:  Recent Labs Lab 12/21/14 0330  AST 35  ALT 25  ALKPHOS 75  BILITOT 0.6  PROT 6.0*  ALBUMIN 3.1*   No results for input(s): LIPASE, AMYLASE in the last 168 hours. No results for input(s): AMMONIA in the last 168 hours. CBC:  Recent Labs Lab 12/20/14 0620 12/22/14 1352 12/22/14 1400  WBC 11.2* 13.3*  --   HGB 12.6*  12.6* 13.9  HCT 36.2* 35.1* 41.0  MCV 85.0 85.0  --   PLT 251 323  --    Cardiac Enzymes:  Recent Labs Lab 12/18/14 1642 12/19/14 0342 12/20/14 0620 12/22/14 1352  CKTOTAL 1652* 1008* 483*  --   TROPONINI  --   --   --  <0.03   BNP (last 3 results) No results for input(s): BNP in the last 8760 hours.  ProBNP (last 3 results) No results for input(s): PROBNP in the last 8760 hours.  CBG: No results for input(s): GLUCAP in the last 168 hours.  Recent Results (from the past 240 hour(s))  Urine culture     Status: None   Collection Time: 12/18/14  6:00 AM  Result Value Ref Range Status   Specimen Description URINE, CLEAN CATCH  Final   Special Requests NONE   Final   Colony Count   Final    >=100,000 COLONIES/ML Performed at [facility]    Culture   Final    ESCHERICHIA COLI Performed at [facility]    Report Status 12/20/2014 FINAL  Final   Organism ID, Bacteria ESCHERICHIA COLI  Final      Susceptibility   Escherichia coli - MIC*    AMPICILLIN >=32 RESISTANT Resistant     CEFAZOLIN <=4 SENSITIVE Sensitive     CEFTRIAXONE <=1 SENSITIVE Sensitive     CIPROFLOXACIN <=0.25 SENSITIVE Sensitive     GENTAMICIN <=1 SENSITIVE Sensitive     LEVOFLOXACIN <=0.12 SENSITIVE Sensitive     NITROFURANTOIN <=16 SENSITIVE Sensitive     TOBRAMYCIN <=1 SENSITIVE Sensitive     TRIMETH/SULFA <=20 SENSITIVE Sensitive     PIP/TAZO <=4 SENSITIVE Sensitive     * ESCHERICHIA COLI     Studies: No results found.  Scheduled Meds: . aspirin EC  81 mg Oral Daily  . heparin  5,000 Units Subcutaneous 3 times per day  . multivitamin with minerals  1 tablet Oral Daily  . pravastatin  80 mg Oral q1800  . sodium chloride  3 mL Intravenous Q12H  . sodium chloride  1 g Oral TID WC   Continuous Infusions:    Time spent: > 35 minutes    Velvet Bathe  [facility] Pager [phone] If 7PM-7AM, please contact night-coverage at [email or web address], password [facility] 12/25/2014, 3:14 PM

## 2014-12-25 NOTE — Progress Notes (Signed)
Writer called to pt room by family members with concerns about discharge planning and medication questions.  Writer advised staff RN to contact MD regarding concerns.  According to staff RN, MD called and spoke with family by phone.  Writer followed up with pt but family had gone home.  Will visit again to ensure all concerns are covered.

## 2014-12-25 NOTE — Clinical Social Work Note (Signed)
CSW discussed bed offers with patient.  Patient wishes for his daughter, Alyse Low to make SNF/STR decisions.  CSW contacted daughter [phone] and reviewed bed offers.  Daughter chose [facility].  [facility] was able to offer a bed.  CSW contacted [facility] to make aware.  Per MD documentation yesterday, projected dc is 1-2 days (from yesterday) making projected discharge: 5/30-5/31.  CSW requested update from MD regarding projection so CSW may update SNF and daughter.  CSW awaiting a return call from MD.  Nonnie Done, LCSW [phone]  Psychiatric & Orthopedics (5N 1-8) Clinical Social Worker   Disposition: [facility].  Daughter to complete paperwork.  [facility] is requested.

## 2014-12-25 NOTE — Progress Notes (Signed)
Writer visited pt to follow up on issues from yesterday.  Pt feels that all issues were addressed but the family members that I initially met with were not present.  I asked the pt to have the family contact me when they arrive if they still have issues or concerns.  Also informed the Charge Nurse to call if family arrives and has concerns.

## 2014-12-26 DIAGNOSIS — I10 Essential (primary) hypertension: Secondary | ICD-10-CM | POA: Diagnosis not present

## 2014-12-26 DIAGNOSIS — R55 Syncope and collapse: Secondary | ICD-10-CM | POA: Diagnosis not present

## 2014-12-26 DIAGNOSIS — R160 Hepatomegaly, not elsewhere classified: Secondary | ICD-10-CM | POA: Diagnosis not present

## 2014-12-26 LAB — BASIC METABOLIC PANEL
ANION GAP: 11 (ref 5–15)
BUN: 10 mg/dL (ref 6–20)
CO2: 27 mmol/L (ref 22–32)
Calcium: 8.4 mg/dL — ABNORMAL LOW (ref 8.9–10.3)
Chloride: 90 mmol/L — ABNORMAL LOW (ref 101–111)
Creatinine, Ser: 1.08 mg/dL (ref 0.61–1.24)
GFR calc Af Amer: >60 mL/min (ref >60)
Glucose, Bld: 92 mg/dL (ref 65–99)
Potassium: 3.6 mmol/L (ref 3.5–5.1)
Sodium: 128 mmol/L — ABNORMAL LOW (ref 135–145)

## 2014-12-26 MED ORDER — CLOPIDOGREL BISULFATE 75 MG PO TABS: 75.0000 mg | ORAL_TABLET | Freq: Every day | ORAL | Status: AC

## 2014-12-26 MED ORDER — CLOPIDOGREL BISULFATE 75 MG PO TABS
75.0000 mg | ORAL_TABLET | Freq: Every day | ORAL | Status: DC
  Administered 2014-12-26: 75 mg via ORAL
  Filled 2014-12-26 (×2): qty 1

## 2014-12-26 NOTE — Clinical Social Work Note (Signed)
Patient medically stable for discharge to [facility] today. Facility admissions staff notified and discharge information transmitted to [facility]. [name] will be transported to skilled facility by ambulalnce ([facility]).   [name], MSW, LCSW Licensed Clinical Social Worker [place] [phone]

## 2014-12-26 NOTE — Progress Notes (Signed)
Physical Therapy Treatment Patient Details Name: Hector Rice MRN: [id number] DOB: 11/06/1945 Today's Date: 12/26/2014    History of Present Illness Patient is a 69 year old male with prior history of CVA with right-sided hemiparesis, hypertension, syncope with MRI revealed small acute infarct involving left corona radiata 5/24 with D/C to [name] and return to [name] 5/27 due to additional syncope    PT Comments    Making progress with mobility and activity and activity tolerance; I agree with SNF stay for consistent therapies and rehab to maximize independence and safety with mobility and balance prior to return to [name]  Follow Up Recommendations  SNF     Equipment Recommendations  None recommended by PT    Recommendations for Other Services       Precautions / Restrictions Precautions Precautions: Fall    Mobility  Bed Mobility Overal bed mobility: Needs Assistance Bed Mobility: Supine to Sit     Supine to sit: Min assist     General bed mobility comments: min handheld assist to pull to sit; counter-pressure given at R thigh also to assist in sitting up; ineffiicient movement to come to sit  Transfers Overall transfer level: Needs assistance Equipment used: Rolling walker (2 wheeled) Transfers: Sit to/from Stand Sit to Stand: Mod assist         General transfer comment: cues for sequence with assist to weight shift and cues for RLE placement; Reports he has been pulling up on RW to come to stand since his CVA 5 years ago; unsteady at initial stand; mod assist for balance  Ambulation/Gait Ambulation/Gait assistance: Min assist Ambulation Distance (Feet): 30 Feet Assistive device: Rolling walker (2 wheeled) Gait Pattern/deviations: Decreased stride length Gait velocity: decreased   General Gait Details: Pt only able to grasp RW with L hand. He guides and maneuvers RW with the LUE. instructed pt to be very aware of risk of RW tipping when holding with one  hand; min assist because of overall unsteadiness and frequent small losses of balance backwards   Stairs            Wheelchair Mobility    Modified Rankin (Stroke Patients Only)       Balance             Standing balance-Leahy Scale: Poor                      Cognition Arousal/Alertness: Awake/alert Behavior During Therapy: WFL for tasks assessed/performed                        Exercises      General Comments        Pertinent Vitals/Pain Pain Assessment: No/denies pain    Home Living                      Prior Function            PT Goals (current goals can now be found in the care plan section) Acute Rehab PT Goals Patient Stated Goal: be able to walk PT Goal Formulation: With patient Time For Goal Achievement: 01/06/15 Potential to Achieve Goals: Good Progress towards PT goals: Progressing toward goals    Frequency  Min 3X/week    PT Plan Current plan remains appropriate    Co-evaluation             End of Session Equipment Utilized During Treatment: Gait belt Activity Tolerance: Patient tolerated treatment well  Patient left: in chair;with call bell/phone within reach;with chair alarm set;with family/visitor present     Time: 1135-1151 PT Time Calculation (min) (ACUTE ONLY): 16 min  Charges:  $Gait Training: 8-22 mins                    G Codes:      Quin Hoop 12/26/2014, 12:43 PM  Roney Marion, [place] Pager [phone] Office [phone]

## 2014-12-27 ENCOUNTER — Inpatient Hospital Stay: Payer: Medicare Other | Admitting: Family

## 2014-12-27 ENCOUNTER — Non-Acute Institutional Stay (SKILLED_NURSING_FACILITY): Payer: Medicare Other | Admitting: Adult Health

## 2014-12-27 ENCOUNTER — Encounter: Payer: Self-pay | Admitting: Adult Health

## 2014-12-27 ENCOUNTER — Telehealth: Payer: Self-pay | Admitting: *Deleted

## 2014-12-27 ENCOUNTER — Other Ambulatory Visit: Payer: Self-pay | Admitting: Physician Assistant

## 2014-12-27 DIAGNOSIS — R160 Hepatomegaly, not elsewhere classified: Secondary | ICD-10-CM

## 2014-12-27 DIAGNOSIS — E43 Unspecified severe protein-calorie malnutrition: Secondary | ICD-10-CM | POA: Diagnosis not present

## 2014-12-27 DIAGNOSIS — I10 Essential (primary) hypertension: Secondary | ICD-10-CM

## 2014-12-27 DIAGNOSIS — R5381 Other malaise: Secondary | ICD-10-CM

## 2014-12-27 DIAGNOSIS — R55 Syncope and collapse: Secondary | ICD-10-CM | POA: Diagnosis not present

## 2014-12-27 DIAGNOSIS — E871 Hypo-osmolality and hyponatremia: Secondary | ICD-10-CM | POA: Diagnosis not present

## 2014-12-27 DIAGNOSIS — K869 Disease of pancreas, unspecified: Secondary | ICD-10-CM

## 2014-12-27 DIAGNOSIS — K8689 Other specified diseases of pancreas: Secondary | ICD-10-CM

## 2014-12-27 DIAGNOSIS — G4700 Insomnia, unspecified: Secondary | ICD-10-CM

## 2014-12-27 DIAGNOSIS — E785 Hyperlipidemia, unspecified: Secondary | ICD-10-CM | POA: Diagnosis not present

## 2014-12-27 DIAGNOSIS — I6309 Cerebral infarction due to thrombosis of other precerebral artery: Secondary | ICD-10-CM
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
## 2014-12-27 NOTE — Telephone Encounter (Signed)
Transition Care Management Follow-up Telephone Call D/C 12/26/14  How have you been since you were released from the hospital? Called pt spoke with daughter states dad is doing ok    Do you understand why you were in the hospital? YES family is aware   Do you understand the discharge instrcutions? YES  Items Reviewed:  Medications reviewed: YES  Allergies reviewed: YES  Dietary changes reviewed: NO  Referrals reviewed: no referral needed   Functional Questionnaire:   Activities of Daily Living (ADLs):   She  states they are independent in the following: bathing and hygiene, feeding, continence, grooming, toileting and dressing States he require assistance with the following: ambulation   Any transportation issues/concerns?: NO  Any patient concerns? NO  Confirmed importance and date/time of follow-up visits scheduled: YES, appt made for 01/05/15 w/Dr. Jenny Reichmann   Confirmed with patient if condition begins to worsen call PCP or go to the ER.

## 2014-12-28 LAB — BASIC METABOLIC PANEL
BUN: 16 mg/dL (ref 4–21)
CREATININE: 1.1 mg/dL (ref 0.6–1.3)
GLUCOSE: 77 mg/dL
Potassium: 4 mmol/L (ref 3.4–5.3)
SODIUM: 130 mmol/L — AB (ref 137–147)

## 2014-12-28 LAB — CBC AND DIFFERENTIAL
HCT: 34 % — AB (ref 41–53)
HEMOGLOBIN: 11.7 g/dL — AB (ref 13.5–17.5)
PLATELETS: 416 10*3/uL — AB (ref 150–399)
WBC: 7.9 10*3/mL

## 2014-12-29 ENCOUNTER — Encounter: Payer: Self-pay | Admitting: Internal Medicine

## 2014-12-29 ENCOUNTER — Other Ambulatory Visit: Payer: Self-pay | Admitting: Physician Assistant

## 2014-12-29 ENCOUNTER — Other Ambulatory Visit: Payer: Self-pay | Admitting: Radiology

## 2014-12-29 ENCOUNTER — Non-Acute Institutional Stay (SKILLED_NURSING_FACILITY): Payer: Medicare Other | Admitting: Internal Medicine

## 2014-12-29 DIAGNOSIS — E871 Hypo-osmolality and hyponatremia: Secondary | ICD-10-CM

## 2014-12-29 DIAGNOSIS — I10 Essential (primary) hypertension: Secondary | ICD-10-CM | POA: Diagnosis not present

## 2014-12-29 DIAGNOSIS — D638 Anemia in other chronic diseases classified elsewhere: Secondary | ICD-10-CM | POA: Diagnosis not present

## 2014-12-29 DIAGNOSIS — R5381 Other malaise: Secondary | ICD-10-CM | POA: Diagnosis not present

## 2014-12-29 DIAGNOSIS — R300 Dysuria: Secondary | ICD-10-CM

## 2014-12-29 DIAGNOSIS — G4700 Insomnia, unspecified: Secondary | ICD-10-CM

## 2014-12-29 DIAGNOSIS — R160 Hepatomegaly, not elsewhere classified: Secondary | ICD-10-CM | POA: Diagnosis not present

## 2014-12-29 DIAGNOSIS — I69351 Hemiplegia and hemiparesis following cerebral infarction affecting right dominant side: Secondary | ICD-10-CM

## 2014-12-29 DIAGNOSIS — E785 Hyperlipidemia, unspecified: Secondary | ICD-10-CM

## 2014-12-29 DIAGNOSIS — E46 Unspecified protein-calorie malnutrition: Secondary | ICD-10-CM | POA: Diagnosis not present

## 2014-12-29 DIAGNOSIS — I69851 Hemiplegia and hemiparesis following other cerebrovascular disease affecting right dominant side: Secondary | ICD-10-CM

## 2014-12-29 DIAGNOSIS — R55 Syncope and collapse: Secondary | ICD-10-CM
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
## 2015-01-01 ENCOUNTER — Ambulatory Visit (HOSPITAL_COMMUNITY): Admit: 2015-01-01 | Payer: Medicare Other

## 2015-01-01 ENCOUNTER — Other Ambulatory Visit: Payer: Self-pay | Admitting: Radiology

## 2015-01-01 ENCOUNTER — Ambulatory Visit (HOSPITAL_COMMUNITY): Admission: RE | Admit: 2015-01-01 | Payer: Medicare Other | Source: Ambulatory Visit

## 2015-01-01 ENCOUNTER — Ambulatory Visit (HOSPITAL_COMMUNITY): Admit: 2015-01-01 | Payer: Medicare Other | Attending: Family Medicine | Admitting: Family Medicine

## 2015-01-03 ENCOUNTER — Ambulatory Visit (HOSPITAL_COMMUNITY)
Admission: RE | Admit: 2015-01-03 | Discharge: 2015-01-03 | Disposition: A | Discharge status: Home / Self Care | Payer: Medicare Other | Source: Ambulatory Visit | Attending: Physician Assistant | Admitting: Physician Assistant

## 2015-01-03 ENCOUNTER — Encounter (HOSPITAL_COMMUNITY): Payer: Self-pay

## 2015-01-03 DIAGNOSIS — Z7982 Long term (current) use of aspirin: Secondary | ICD-10-CM | POA: Insufficient documentation

## 2015-01-03 DIAGNOSIS — C7A1 Malignant poorly differentiated neuroendocrine tumors: Secondary | ICD-10-CM | POA: Diagnosis not present

## 2015-01-03 DIAGNOSIS — I10 Essential (primary) hypertension: Secondary | ICD-10-CM | POA: Diagnosis not present

## 2015-01-03 DIAGNOSIS — I69351 Hemiplegia and hemiparesis following cerebral infarction affecting right dominant side: Secondary | ICD-10-CM | POA: Diagnosis not present

## 2015-01-03 DIAGNOSIS — R160 Hepatomegaly, not elsewhere classified: Secondary | ICD-10-CM

## 2015-01-03 DIAGNOSIS — Z87891 Personal history of nicotine dependence: Secondary | ICD-10-CM | POA: Diagnosis not present

## 2015-01-03 DIAGNOSIS — R911 Solitary pulmonary nodule: Secondary | ICD-10-CM | POA: Diagnosis not present

## 2015-01-03 DIAGNOSIS — Z79899 Other long term (current) drug therapy: Secondary | ICD-10-CM | POA: Diagnosis not present

## 2015-01-03 DIAGNOSIS — K869 Disease of pancreas, unspecified: Secondary | ICD-10-CM | POA: Insufficient documentation

## 2015-01-03 DIAGNOSIS — Z7902 Long term (current) use of antithrombotics/antiplatelets: Secondary | ICD-10-CM | POA: Insufficient documentation

## 2015-01-03 LAB — CBC
HCT: 40.8 % (ref 39.0–52.0)
Hemoglobin: 14 g/dL (ref 13.0–17.0)
MCH: 29.7 pg (ref 26.0–34.0)
MCHC: 34.3 g/dL (ref 30.0–36.0)
MCV: 86.4 fL (ref 78.0–100.0)
Platelets: 429 10*3/uL — ABNORMAL HIGH (ref 150–400)
RBC: 4.72 MIL/uL (ref 4.22–5.81)
RDW: 12.2 % (ref 11.5–15.5)
WBC: 9.9 10*3/uL (ref 4.0–10.5)

## 2015-01-03 LAB — PROTIME-INR
INR: 1.1 (ref 0.00–1.49)
PROTHROMBIN TIME: 14.4 s (ref 11.6–15.2)

## 2015-01-03 LAB — APTT: aPTT: 26 seconds (ref 24–37)

## 2015-01-03 MED ORDER — MIDAZOLAM HCL 2 MG/2ML IJ SOLN
INTRAMUSCULAR | Status: AC | PRN
Start: 2015-01-03 — End: 2015-01-03
  Administered 2015-01-03: 0.5 mg via INTRAVENOUS

## 2015-01-03 MED ORDER — MIDAZOLAM HCL 2 MG/2ML IJ SOLN
INTRAMUSCULAR | Status: AC
  Filled 2015-01-03: qty 2

## 2015-01-03 MED ORDER — LIDOCAINE HCL (PF) 1 % IJ SOLN
INTRAMUSCULAR | Status: AC
  Filled 2015-01-03: qty 10

## 2015-01-03 MED ORDER — OXYCODONE HCL 5 MG PO TABS: 5.0000 mg | ORAL_TABLET | ORAL | Status: DC | PRN

## 2015-01-03 MED ORDER — SODIUM CHLORIDE 0.9 % IV SOLN
INTRAVENOUS | Status: DC
  Administered 2015-01-03: 10:00:00 via INTRAVENOUS

## 2015-01-03 MED ORDER — FENTANYL CITRATE (PF) 100 MCG/2ML IJ SOLN
INTRAMUSCULAR | Status: AC | PRN
  Administered 2015-01-03: 12.5 ug via INTRAVENOUS

## 2015-01-03 MED ORDER — FENTANYL CITRATE (PF) 100 MCG/2ML IJ SOLN
INTRAMUSCULAR | Status: AC
  Filled 2015-01-03: qty 2

## 2015-01-03 NOTE — Procedures (Signed)
US guided core biopsies of left hepatic lesion.  3 cores obtained.  Minimal blood loss.  No immediate complication. See full report in PACS.
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
## 2015-01-03 NOTE — Sedation Documentation (Signed)
Patient denies pain and is resting comfortably.  

## 2015-01-03 NOTE — Discharge Instructions (Signed)
Liver Biopsy, Care After °These instructions give you information on caring for yourself after your procedure. Your doctor may also give you more specific instructions. Call your doctor if you have any problems or questions after your procedure. °HOME CARE °· Rest at home for 1-2 days or as told by your doctor. °· Have someone stay with you for at least 24 hours. °· Do not do these things in the first 24 hours: °¨ Drive. °¨ Use machinery. °¨ Take care of other people. °¨ Sign legal documents. °¨ Take a bath or shower. °· There are many different ways to close and cover a cut (incision). For example, a cut can be closed with stitches, skin glue, or adhesive strips. Follow your doctor's instructions on: °¨ Taking care of your cut. °¨ Changing and removing your bandage (dressing). °¨ Removing whatever was used to close your cut. °· Do not drink alcohol in the first week. °· Do not lift more than 5 pounds or play contact sports for the first 2 weeks. °· Take medicines only as told by your doctor. For 1 week, do not take medicine that has aspirin in it or medicines like ibuprofen. °· Get your test results. °GET HELP IF: °· A cut bleeds and leaves more than just a small spot of blood. °· A cut is red, puffs up (swells), or hurts more than before. °· Fluid or something else comes from a cut. °· A cut smells bad. °· You have a fever or chills. °GET HELP RIGHT AWAY IF: °· You have swelling, bloating, or pain in your belly (abdomen). °· You get dizzy or faint. °· You have a rash. °· You feel sick to your stomach (nauseous) or throw up (vomit). °· You have trouble breathing, feel short of breath, or feel faint. °· Your chest hurts. °· You have problems talking or seeing. °· You have trouble balancing or moving your arms or legs. °Document Released: 04/22/2008 Document Revised: 11/28/2013 Document Reviewed: 09/09/2013 °[employer]® Patient Information ©2015 [employer]. This information is not intended to replace advice given to  you by your health care provider. Make sure you discuss any questions you have with your health care provider. ° °

## 2015-01-04 ENCOUNTER — Ambulatory Visit (HOSPITAL_COMMUNITY): Payer: Medicare Other

## 2015-01-05 ENCOUNTER — Encounter: Payer: Self-pay | Admitting: Internal Medicine

## 2015-01-05 ENCOUNTER — Ambulatory Visit (INDEPENDENT_AMBULATORY_CARE_PROVIDER_SITE_OTHER): Payer: Medicare Other | Admitting: Internal Medicine

## 2015-01-05 VITALS — BP 162/70 | HR 103 | Temp 97.5°F | Wt 116.0 lb

## 2015-01-05 DIAGNOSIS — E43 Unspecified severe protein-calorie malnutrition: Secondary | ICD-10-CM | POA: Diagnosis not present

## 2015-01-05 DIAGNOSIS — I639 Cerebral infarction, unspecified: Secondary | ICD-10-CM

## 2015-01-05 DIAGNOSIS — I10 Essential (primary) hypertension: Secondary | ICD-10-CM

## 2015-01-05 DIAGNOSIS — C801 Malignant (primary) neoplasm, unspecified: Secondary | ICD-10-CM | POA: Insufficient documentation

## 2015-01-05 MED ORDER — AMLODIPINE BESYLATE 5 MG PO TABS: 5.0000 mg | ORAL_TABLET | Freq: Every day | ORAL | Status: DC

## 2015-01-05 NOTE — Assessment & Plan Note (Signed)
Also encouraged po intake, advised to not return to independent liviing but at least assisted living would be more appropriate
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
## 2015-01-05 NOTE — Progress Notes (Signed)
Pre visit review using our clinic review tool, if applicable. No additional management support is needed unless otherwise documented below in the visit note. 

## 2015-01-05 NOTE — Assessment & Plan Note (Signed)
Ok for [employer] 5 qd, o/w stable overall by history and exam, recent data reviewed with pt, and pt to continue medical treatment as before,  to f/u any worsening symptoms or concerns BP Readings from Last 3 Encounters:  01/05/15 162/70  01/03/15 165/72  12/29/14 128/76

## 2015-01-05 NOTE — Assessment & Plan Note (Signed)
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

## 2015-01-05 NOTE — Patient Instructions (Signed)
Please take all new medication as prescribed - the amlodipine 5 mg per day  Please continue all other medications as before  Please have the pharmacy call with any other refills you may need.  Please keep your appointments with your specialists as you may have planned  You will be contacted regarding the referral for: Oncology

## 2015-01-08 ENCOUNTER — Encounter: Payer: Self-pay | Admitting: *Deleted

## 2015-01-08 ENCOUNTER — Telehealth: Payer: Self-pay | Admitting: *Deleted

## 2015-01-08 NOTE — Telephone Encounter (Signed)
Oncology Nurse Navigator Documentation  Oncology Nurse Navigator Flowsheets 01/08/2015  Referral date to RadOnc/MedOnc 01/08/2015  Navigator Encounter Type Introductory phone call/Recieved referral today from Dr. Benay Spice.  I called daughter per EMR note with appt.  I left my name and phone number to call with appt  Time Spent with Patient 15

## 2015-01-08 NOTE — Progress Notes (Unsigned)
Oncology Nurse Navigator Documentation  Oncology Nurse Navigator Flowsheets 01/08/2015  Referral date to RadOnc/MedOnc [place]  Navigator Encounter Type Phone  Treatment Phase Other/Pre-Tx  Coordination of Care MD Appointments/Daughter called back.  I gave her appt to see Dr. Julien Nordmann at thoracic clinic on 01/11/15 arrive at 2pm.    Time Spent with Patient 15

## 2015-01-11 ENCOUNTER — Ambulatory Visit (HOSPITAL_BASED_OUTPATIENT_CLINIC_OR_DEPARTMENT_OTHER): Payer: Medicare Other | Admitting: Internal Medicine

## 2015-01-11 ENCOUNTER — Telehealth: Payer: Self-pay | Admitting: Internal Medicine

## 2015-01-11 ENCOUNTER — Encounter: Payer: Self-pay | Admitting: Internal Medicine

## 2015-01-11 ENCOUNTER — Encounter: Payer: Self-pay | Admitting: *Deleted

## 2015-01-11 VITALS — BP 166/88 | HR 107 | Temp 98.6°F | Resp 19 | Ht 69.0 in | Wt 115.7 lb

## 2015-01-11 DIAGNOSIS — C7A8 Other malignant neuroendocrine tumors: Secondary | ICD-10-CM | POA: Diagnosis not present

## 2015-01-11 DIAGNOSIS — I6309 Cerebral infarction due to thrombosis of other precerebral artery: Secondary | ICD-10-CM

## 2015-01-11 DIAGNOSIS — K8689 Other specified diseases of pancreas: Secondary | ICD-10-CM

## 2015-01-11 DIAGNOSIS — C7B8 Other secondary neuroendocrine tumors: Secondary | ICD-10-CM | POA: Diagnosis not present

## 2015-01-11 DIAGNOSIS — Z5111 Encounter for antineoplastic chemotherapy: Secondary | ICD-10-CM

## 2015-01-11 DIAGNOSIS — C3491 Malignant neoplasm of unspecified part of right bronchus or lung: Secondary | ICD-10-CM

## 2015-01-11 DIAGNOSIS — C3490 Malignant neoplasm of unspecified part of unspecified bronchus or lung: Secondary | ICD-10-CM | POA: Insufficient documentation

## 2015-01-11 DIAGNOSIS — C801 Malignant (primary) neoplasm, unspecified: Secondary | ICD-10-CM

## 2015-01-11 DIAGNOSIS — F1010 Alcohol abuse, uncomplicated: Secondary | ICD-10-CM
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
## 2015-01-11 NOTE — Telephone Encounter (Signed)
Gave and printed appt sched and avs for pt for June and July  °

## 2015-01-11 NOTE — Progress Notes (Signed)
Oncology Nurse Navigator Documentation  Oncology Nurse Navigator Flowsheets 01/08/2015 01/08/2015 01/11/2015  Referral date to RadOnc/MedOnc 01/08/2015 - -  Navigator Encounter Type Introductory phone call - Clinic/MDC  Patient Visit Type - - Initial  Treatment Phase - Other Other  Barriers/Navigation Needs - - Family concerns  Coordination of Care - MD Appointments Other  Education Method - - Verbal  Support Groups/Services - - Friends and Family  Time Spent with Patient 15 15 36

## 2015-01-11 NOTE — Telephone Encounter (Signed)
Gave and printed appt sched and avs fo rpt June and jULY

## 2015-01-12 ENCOUNTER — Encounter: Payer: Self-pay | Admitting: Adult Health

## 2015-01-12 ENCOUNTER — Non-Acute Institutional Stay (SKILLED_NURSING_FACILITY): Payer: Medicare Other | Admitting: Adult Health

## 2015-01-12 DIAGNOSIS — I10 Essential (primary) hypertension: Secondary | ICD-10-CM

## 2015-01-12 DIAGNOSIS — G4700 Insomnia, unspecified: Secondary | ICD-10-CM | POA: Diagnosis not present

## 2015-01-12 DIAGNOSIS — E785 Hyperlipidemia, unspecified: Secondary | ICD-10-CM

## 2015-01-12 DIAGNOSIS — K8689 Other specified diseases of pancreas: Secondary | ICD-10-CM

## 2015-01-12 DIAGNOSIS — K869 Disease of pancreas, unspecified: Secondary | ICD-10-CM

## 2015-01-12 DIAGNOSIS — R5381 Other malaise: Secondary | ICD-10-CM

## 2015-01-12 DIAGNOSIS — R160 Hepatomegaly, not elsewhere classified: Secondary | ICD-10-CM | POA: Diagnosis not present

## 2015-01-12 DIAGNOSIS — E871 Hypo-osmolality and hyponatremia: Secondary | ICD-10-CM

## 2015-01-12 DIAGNOSIS — E46 Unspecified protein-calorie malnutrition: Secondary | ICD-10-CM | POA: Diagnosis not present
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
## 2015-01-13 DIAGNOSIS — Z5111 Encounter for antineoplastic chemotherapy: Secondary | ICD-10-CM | POA: Insufficient documentation

## 2015-01-13 MED ORDER — PROCHLORPERAZINE MALEATE 10 MG PO TABS
10.0000 mg | ORAL_TABLET | Freq: Four times a day (QID) | ORAL | Status: AC | PRN
Start: 2015-01-13 — End: ?

## 2015-01-17 ENCOUNTER — Encounter: Payer: Self-pay | Admitting: *Deleted

## 2015-01-17 ENCOUNTER — Other Ambulatory Visit: Payer: Self-pay | Admitting: *Deleted

## 2015-01-17 ENCOUNTER — Other Ambulatory Visit: Payer: Medicare Other

## 2015-01-17 ENCOUNTER — Other Ambulatory Visit (HOSPITAL_BASED_OUTPATIENT_CLINIC_OR_DEPARTMENT_OTHER): Payer: Medicare Other

## 2015-01-17 ENCOUNTER — Ambulatory Visit (HOSPITAL_BASED_OUTPATIENT_CLINIC_OR_DEPARTMENT_OTHER): Payer: Medicare Other

## 2015-01-17 VITALS — BP 143/79 | HR 98 | Temp 98.0°F | Resp 16

## 2015-01-17 DIAGNOSIS — C7A8 Other malignant neuroendocrine tumors: Secondary | ICD-10-CM

## 2015-01-17 DIAGNOSIS — C3490 Malignant neoplasm of unspecified part of unspecified bronchus or lung: Secondary | ICD-10-CM

## 2015-01-17 DIAGNOSIS — C3491 Malignant neoplasm of unspecified part of right bronchus or lung: Secondary | ICD-10-CM

## 2015-01-17 DIAGNOSIS — Z5111 Encounter for antineoplastic chemotherapy: Secondary | ICD-10-CM | POA: Diagnosis not present

## 2015-01-17 DIAGNOSIS — C7B8 Other secondary neuroendocrine tumors: Secondary | ICD-10-CM

## 2015-01-17 DIAGNOSIS — C801 Malignant (primary) neoplasm, unspecified: Secondary | ICD-10-CM

## 2015-01-17 LAB — COMPREHENSIVE METABOLIC PANEL (CC13)
ALK PHOS: 117 U/L (ref 40–150)
ALT: 30 U/L (ref 0–55)
AST: 38 U/L — AB (ref 5–34)
Albumin: 3.5 g/dL (ref 3.5–5.0)
Anion Gap: 11 mEq/L (ref 3–11)
BUN: 12.6 mg/dL (ref 7.0–26.0)
CO2: 31 mEq/L — ABNORMAL HIGH (ref 22–29)
CREATININE: 1.1 mg/dL (ref 0.7–1.3)
Calcium: 9.3 mg/dL (ref 8.4–10.4)
Chloride: 90 mEq/L — ABNORMAL LOW (ref 98–109)
EGFR: 66 mL/min/{1.73_m2} — ABNORMAL LOW (ref >90)
Glucose: 109 mg/dl (ref 70–140)
Potassium: 2.9 mEq/L — CL (ref 3.5–5.1)
SODIUM: 131 meq/L — AB (ref 136–145)
TOTAL PROTEIN: 7 g/dL (ref 6.4–8.3)
Total Bilirubin: 0.73 mg/dL (ref 0.20–1.20)

## 2015-01-17 LAB — CBC WITH DIFFERENTIAL/PLATELET
BASO%: 0.3 % (ref 0.0–2.0)
BASOS ABS: 0 10*3/uL (ref 0.0–0.1)
EOS%: 1 % (ref 0.0–7.0)
Eosinophils Absolute: 0.1 10*3/uL (ref 0.0–0.5)
HEMATOCRIT: 38.9 % (ref 38.4–49.9)
HGB: 13.8 g/dL (ref 13.0–17.1)
LYMPH#: 1.3 10*3/uL (ref 0.9–3.3)
LYMPH%: 13.7 % — ABNORMAL LOW (ref 14.0–49.0)
MCH: 29.9 pg (ref 27.2–33.4)
MCHC: 35.5 g/dL (ref 32.0–36.0)
MCV: 84.2 fL (ref 79.3–98.0)
MONO#: 1.1 10*3/uL — ABNORMAL HIGH (ref 0.1–0.9)
MONO%: 11.6 % (ref 0.0–14.0)
NEUT%: 73.4 % (ref 39.0–75.0)
NEUTROS ABS: 6.8 10*3/uL — AB (ref 1.5–6.5)
Platelets: 313 10*3/uL (ref 140–400)
RBC: 4.62 10*6/uL (ref 4.20–5.82)
RDW: 12 % (ref 11.0–14.6)
WBC: 9.3 10*3/uL (ref 4.0–10.3)

## 2015-01-17 MED ORDER — SODIUM CHLORIDE 0.9 % IV SOLN
360.5000 mg | Freq: Once | INTRAVENOUS | Status: AC
  Administered 2015-01-17: 360 mg via INTRAVENOUS
  Filled 2015-01-17: qty 36

## 2015-01-17 MED ORDER — SODIUM CHLORIDE 0.9 % IV SOLN
Freq: Once | INTRAVENOUS | Status: AC
  Administered 2015-01-17: 12:00:00 via INTRAVENOUS

## 2015-01-17 MED ORDER — SODIUM CHLORIDE 0.9 % IV SOLN
Freq: Once | INTRAVENOUS | Status: AC
  Administered 2015-01-17: 12:00:00 via INTRAVENOUS
  Filled 2015-01-17: qty 8

## 2015-01-17 MED ORDER — POTASSIUM CHLORIDE 2 MEQ/ML IV SOLN
Freq: Once | INTRAVENOUS | Status: AC
  Administered 2015-01-17: 12:00:00 via INTRAVENOUS
  Filled 2015-01-17: qty 1000

## 2015-01-17 MED ORDER — POTASSIUM CHLORIDE 2 MEQ/ML IV SOLN
INTRAVENOUS | Status: DC
  Filled 2015-01-17: qty 1000

## 2015-01-17 MED ORDER — POTASSIUM CHLORIDE CRYS ER 20 MEQ PO TBCR: 20.0000 meq | EXTENDED_RELEASE_TABLET | Freq: Two times a day (BID) | ORAL | Status: DC

## 2015-01-17 MED ORDER — SODIUM CHLORIDE 0.9 % IV SOLN
100.0000 mg/m2 | Freq: Once | INTRAVENOUS | Status: AC
  Administered 2015-01-17: 160 mg via INTRAVENOUS
  Filled 2015-01-17: qty 8

## 2015-01-17 NOTE — Telephone Encounter (Signed)
Pt notified K+ called in pharmacy 1 tablet BID

## 2015-01-17 NOTE — Progress Notes (Signed)
Oncology Nurse Navigator Documentation  Oncology Nurse Navigator Flowsheets 01/17/2015  Navigator Encounter Type Face to face 1st tx  Patient Visit Type Medonc  Treatment Phase First Chemo Tx  Barriers/Navigation Needs Education/I spoke with patient today in chemo.  I asked how he was feeling about 1 st treatment.  He stated he was a little anxious but ready to get started.  I listened as he explained.  I did ask him if he had nausea medication at home.  He stated he thought so.  I told him to call cancer center if he had any questions or concerns about post chemo symptoms or medications.  He stated he would call if needed.   Education Other  Education Method Verbal  Time Spent with Patient 15

## 2015-01-17 NOTE — Patient Instructions (Signed)
[facility] Discharge Instructions for Patients Receiving Chemotherapy  Today you received the following chemotherapy agents Carboplatin/ Etoposide  To help prevent nausea and vomiting after your treatment, we encourage you to take your nausea medication as needed   If you develop nausea and vomiting that is not controlled by your nausea medication, call the clinic.   BELOW ARE SYMPTOMS THAT SHOULD BE REPORTED IMMEDIATELY:  *FEVER GREATER THAN 100.5 F  *CHILLS WITH OR WITHOUT FEVER  NAUSEA AND VOMITING THAT IS NOT CONTROLLED WITH YOUR NAUSEA MEDICATION  *UNUSUAL SHORTNESS OF BREATH  *UNUSUAL BRUISING OR BLEEDING  TENDERNESS IN MOUTH AND THROAT WITH OR WITHOUT PRESENCE OF ULCERS  *URINARY PROBLEMS  *BOWEL PROBLEMS  UNUSUAL RASH Items with * indicate a potential emergency and should be followed up as soon as possible.  Feel free to call the clinic you have any questions or concerns. The clinic phone number is [phone].  Please show the [place] at check-in to the Emergency Department and triage nurse.

## 2015-01-18 ENCOUNTER — Ambulatory Visit (HOSPITAL_BASED_OUTPATIENT_CLINIC_OR_DEPARTMENT_OTHER): Payer: Medicare Other

## 2015-01-18 ENCOUNTER — Telehealth: Payer: Self-pay | Admitting: Family

## 2015-01-18 VITALS — BP 164/76 | HR 83 | Temp 97.5°F | Resp 17

## 2015-01-18 DIAGNOSIS — C7A8 Other malignant neuroendocrine tumors: Secondary | ICD-10-CM | POA: Diagnosis not present

## 2015-01-18 DIAGNOSIS — C3491 Malignant neoplasm of unspecified part of right bronchus or lung: Secondary | ICD-10-CM

## 2015-01-18 DIAGNOSIS — Z5111 Encounter for antineoplastic chemotherapy: Secondary | ICD-10-CM

## 2015-01-18 DIAGNOSIS — C801 Malignant (primary) neoplasm, unspecified: Secondary | ICD-10-CM

## 2015-01-18 DIAGNOSIS — C7B8 Other secondary neuroendocrine tumors: Secondary | ICD-10-CM | POA: Diagnosis not present

## 2015-01-18 MED ORDER — SODIUM CHLORIDE 0.9 % IV SOLN
Freq: Once | INTRAVENOUS | Status: AC
  Administered 2015-01-18: 14:00:00 via INTRAVENOUS

## 2015-01-18 MED ORDER — PROCHLORPERAZINE MALEATE 10 MG PO TABS
ORAL_TABLET | ORAL | Status: AC
  Filled 2015-01-18: qty 1

## 2015-01-18 MED ORDER — ETOPOSIDE CHEMO INJECTION 1 GM/50ML
100.0000 mg/m2 | Freq: Once | INTRAVENOUS | Status: AC
  Administered 2015-01-18: 160 mg via INTRAVENOUS
  Filled 2015-01-18: qty 8

## 2015-01-18 MED ORDER — PROCHLORPERAZINE MALEATE 10 MG PO TABS
10.0000 mg | ORAL_TABLET | Freq: Once | ORAL | Status: AC
  Administered 2015-01-18: 10 mg via ORAL

## 2015-01-18 NOTE — Patient Instructions (Signed)
[facility] Discharge Instructions for Patients Receiving Chemotherapy  Today you received the following chemotherapy agents: Etoposide   To help prevent nausea and vomiting after your treatment, we encourage you to take your nausea medication as directed.    If you develop nausea and vomiting that is not controlled by your nausea medication, call the clinic.   BELOW ARE SYMPTOMS THAT SHOULD BE REPORTED IMMEDIATELY:  *FEVER GREATER THAN 100.5 F  *CHILLS WITH OR WITHOUT FEVER  NAUSEA AND VOMITING THAT IS NOT CONTROLLED WITH YOUR NAUSEA MEDICATION  *UNUSUAL SHORTNESS OF BREATH  *UNUSUAL BRUISING OR BLEEDING  TENDERNESS IN MOUTH AND THROAT WITH OR WITHOUT PRESENCE OF ULCERS  *URINARY PROBLEMS  *BOWEL PROBLEMS  UNUSUAL RASH Items with * indicate a potential emergency and should be followed up as soon as possible.  Feel free to call the clinic you have any questions or concerns. The clinic phone number is [phone].  Please show the CHEMO ALERT CARD at check-in to the Emergency Department and triage nurse.   

## 2015-01-18 NOTE — Telephone Encounter (Signed)
Faxed FL2 on 6/23 to [place] at [phone]

## 2015-01-18 NOTE — Progress Notes (Signed)
Spoke with Stanton Kidney, RN about pt's K+ level of 2.9 from 6/22 labs.  Stanton Kidney said okay to treat and no further interventions needed since pt received IV replacement yesterday and prescription called in for oral K+.  I did confirm with pt family that this prescription was picked up and pt has begun taking K+.  No further questions from family or patient at this time.

## 2015-01-19 ENCOUNTER — Ambulatory Visit (HOSPITAL_BASED_OUTPATIENT_CLINIC_OR_DEPARTMENT_OTHER): Payer: Medicare Other

## 2015-01-19 ENCOUNTER — Ambulatory Visit: Payer: Medicare Other

## 2015-01-19 ENCOUNTER — Encounter: Payer: Self-pay | Admitting: General Practice

## 2015-01-19 VITALS — BP 167/82 | HR 90 | Temp 97.6°F | Resp 18

## 2015-01-19 DIAGNOSIS — Z5111 Encounter for antineoplastic chemotherapy: Secondary | ICD-10-CM

## 2015-01-19 DIAGNOSIS — C7B8 Other secondary neuroendocrine tumors: Secondary | ICD-10-CM | POA: Diagnosis not present

## 2015-01-19 DIAGNOSIS — C7A8 Other malignant neuroendocrine tumors: Secondary | ICD-10-CM

## 2015-01-19 DIAGNOSIS — Z5189 Encounter for other specified aftercare: Secondary | ICD-10-CM

## 2015-01-19 DIAGNOSIS — C3491 Malignant neoplasm of unspecified part of right bronchus or lung: Secondary | ICD-10-CM

## 2015-01-19 DIAGNOSIS — C801 Malignant (primary) neoplasm, unspecified: Secondary | ICD-10-CM

## 2015-01-19 MED ORDER — PEGFILGRASTIM 6 MG/0.6ML ~~LOC~~ PSKT
6.0000 mg | PREFILLED_SYRINGE | Freq: Once | SUBCUTANEOUS | Status: AC
  Administered 2015-01-19: 6 mg via SUBCUTANEOUS
  Filled 2015-01-19: qty 0.6

## 2015-01-19 MED ORDER — PROCHLORPERAZINE MALEATE 10 MG PO TABS
10.0000 mg | ORAL_TABLET | Freq: Once | ORAL | Status: AC
  Administered 2015-01-19: 10 mg via ORAL

## 2015-01-19 MED ORDER — SODIUM CHLORIDE 0.9 % IV SOLN
100.0000 mg/m2 | Freq: Once | INTRAVENOUS | Status: AC
  Administered 2015-01-19: 160 mg via INTRAVENOUS
  Filled 2015-01-19: qty 8

## 2015-01-19 MED ORDER — PROCHLORPERAZINE MALEATE 10 MG PO TABS
ORAL_TABLET | ORAL | Status: AC
  Filled 2015-01-19: qty 1

## 2015-01-19 MED ORDER — SODIUM CHLORIDE 0.9 % IV SOLN
250.0000 mL | Freq: Once | INTRAVENOUS | Status: AC
  Administered 2015-01-19: 250 mL via INTRAVENOUS

## 2015-01-19 NOTE — Progress Notes (Unsigned)
Spiritual Care Note  Received phone call from daughter Donalda Ewings, who met me through attending chemo class with pt, because she wanted more information about pastoral support for her dad.  Answered her questions and provided spiritual and emotional support to her as she processed hopes, grief, and caregiver stress.  Per her referral, visited with Mr Kurek in infusion room.  He brightened with recognition and welcomed pastoral presence.  He alluded to distress and became tearful, but preferred privacy for coping. Normalized feelings, honored his wishes, and reminded him of my ongoing availability for support (provided card).  He verbalized appreciation for thoughtfulness and care.  Anticipate f/u call from [facility] to schedule appointment to process and reflect.  Following pt for support, and will mail f/u note of encouragement to Mr Smoak, but please also page as needs arise.  Thank you.  [place], [place]

## 2015-01-19 NOTE — Patient Instructions (Signed)
[facility] Discharge Instructions for Patients Receiving Chemotherapy  Today you received the following chemotherapy agents Etoposide  To help prevent nausea and vomiting after your treatment, we encourage you to take your nausea medication as needed   If you develop nausea and vomiting that is not controlled by your nausea medication, call the clinic.   BELOW ARE SYMPTOMS THAT SHOULD BE REPORTED IMMEDIATELY:  *FEVER GREATER THAN 100.5 F  *CHILLS WITH OR WITHOUT FEVER  NAUSEA AND VOMITING THAT IS NOT CONTROLLED WITH YOUR NAUSEA MEDICATION  *UNUSUAL SHORTNESS OF BREATH  *UNUSUAL BRUISING OR BLEEDING  TENDERNESS IN MOUTH AND THROAT WITH OR WITHOUT PRESENCE OF ULCERS  *URINARY PROBLEMS  *BOWEL PROBLEMS  UNUSUAL RASH Items with * indicate a potential emergency and should be followed up as soon as possible.  Feel free to call the clinic you have any questions or concerns. The clinic phone number is [phone].  Please show the CHEMO ALERT CARD at check-in to the Emergency Department and triage nurse.   

## 2015-01-23 ENCOUNTER — Ambulatory Visit: Payer: Medicare Other | Admitting: Neurology

## 2015-01-24 ENCOUNTER — Ambulatory Visit: Payer: Medicare Other | Admitting: Nutrition

## 2015-01-24 ENCOUNTER — Ambulatory Visit (HOSPITAL_BASED_OUTPATIENT_CLINIC_OR_DEPARTMENT_OTHER): Payer: Medicare Other | Admitting: Physician Assistant

## 2015-01-24 ENCOUNTER — Encounter: Payer: Self-pay | Admitting: Physician Assistant

## 2015-01-24 ENCOUNTER — Other Ambulatory Visit (HOSPITAL_BASED_OUTPATIENT_CLINIC_OR_DEPARTMENT_OTHER): Payer: Medicare Other

## 2015-01-24 VITALS — BP 160/78 | HR 94 | Temp 97.9°F | Resp 18 | Ht 69.0 in | Wt 112.0 lb

## 2015-01-24 DIAGNOSIS — C7B8 Other secondary neuroendocrine tumors: Secondary | ICD-10-CM

## 2015-01-24 DIAGNOSIS — C7A8 Other malignant neuroendocrine tumors: Secondary | ICD-10-CM

## 2015-01-24 DIAGNOSIS — C801 Malignant (primary) neoplasm, unspecified: Secondary | ICD-10-CM

## 2015-01-24 LAB — COMPREHENSIVE METABOLIC PANEL (CC13)
ALBUMIN: 3.3 g/dL — AB (ref 3.5–5.0)
ALT: 23 U/L (ref 0–55)
AST: 21 U/L (ref 5–34)
Alkaline Phosphatase: 133 U/L (ref 40–150)
Anion Gap: 10 mEq/L (ref 3–11)
BUN: 12.2 mg/dL (ref 7.0–26.0)
CALCIUM: 8.6 mg/dL (ref 8.4–10.4)
CHLORIDE: 95 meq/L — AB (ref 98–109)
CO2: 28 mEq/L (ref 22–29)
Creatinine: 1.3 mg/dL (ref 0.7–1.3)
EGFR: 55 mL/min/{1.73_m2} — ABNORMAL LOW (ref >90)
Glucose: 103 mg/dl (ref 70–140)
POTASSIUM: 3.2 meq/L — AB (ref 3.5–5.1)
SODIUM: 133 meq/L — AB (ref 136–145)
TOTAL PROTEIN: 6.5 g/dL (ref 6.4–8.3)
Total Bilirubin: 0.56 mg/dL (ref 0.20–1.20)

## 2015-01-24 LAB — CBC WITH DIFFERENTIAL/PLATELET
BASO%: 0.6 % (ref 0.0–2.0)
Basophils Absolute: 0 10*3/uL (ref 0.0–0.1)
EOS%: 2.8 % (ref 0.0–7.0)
Eosinophils Absolute: 0.1 10*3/uL (ref 0.0–0.5)
HEMATOCRIT: 35.6 % — AB (ref 38.4–49.9)
HEMOGLOBIN: 12.1 g/dL — AB (ref 13.0–17.1)
LYMPH#: 0.7 10*3/uL — AB (ref 0.9–3.3)
LYMPH%: 19.9 % (ref 14.0–49.0)
MCH: 29.3 pg (ref 27.2–33.4)
MCHC: 34 g/dL (ref 32.0–36.0)
MCV: 86.1 fL (ref 79.3–98.0)
MONO#: 0.2 10*3/uL (ref 0.1–0.9)
MONO%: 4.4 % (ref 0.0–14.0)
NEUT%: 72.3 % (ref 39.0–75.0)
NEUTROS ABS: 2.7 10*3/uL (ref 1.5–6.5)
Platelets: 190 10*3/uL (ref 140–400)
RBC: 4.13 10*6/uL — ABNORMAL LOW (ref 4.20–5.82)
RDW: 12.3 % (ref 11.0–14.6)
WBC: 3.7 10*3/uL — AB (ref 4.0–10.3)

## 2015-01-24 NOTE — Progress Notes (Signed)
69 year old male diagnosed with metastatic small cell lung cancer.  He is a patient of Dr. Earlie Server.  Past medical history includes hypertension, hyperlipidemia, stroke with right hemiparesis and severe malnutrition.  Medications include Lipitor, vitamin D3, folic acid, MVI, Compazine, and vitamin B1.  Labs include sodium of 130 on June 2.  Height: 69 inches. Weight: 112 pounds on June 29. Usual body weight: 125 pounds April 2016. BMI: 16.53.  Patient reports he lives at assisted living. His appetite comes and gos. He denies difficulty chewing or swallowing. He reports nausea after chemotherapy which was improved by taking nausea medication. Patient does report constipation but had a bowel movement yesterday. Patient does not care for ensure or boost oral nutrition supplements.  Nutrition diagnosis: Malnutrition related to inadequate oral intake as evidenced by BMI of 16.53, 11% weight loss, severe depletion muscle and fat stores.  Intervention: Patient educated to increase meals and snacks to 6 times daily by consuming high-calorie high-protein foods. Recommended patient drink Carnation breakfast essentials mixed with whole milk. Provided samples and coupons. Educated patient on strategies for increasing calories.  Encouraged patient to ask for assistance in the dining hall when needed. Educated patient on snacks in his refrigerator in his apartment. Provided education fact sheets for patient to review. Questions were answered.  Teach back method used.  Contact information was given.  Monitoring, evaluation, goals: Patient will tolerate adequate calories and protein to minimize further weight loss.  Next visit: Wednesday, July 13 during infusion.  **Disclaimer: This note was dictated with voice recognition software. Similar sounding words can inadvertently be transcribed and this note may contain transcription errors which may not have been corrected upon publication of  note.**

## 2015-01-24 NOTE — Progress Notes (Signed)
Quick Note:  Call patient with the result and encourage K rich diet ______ 
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
## 2015-01-25 ENCOUNTER — Telehealth: Payer: Self-pay | Admitting: Medical Oncology

## 2015-01-25 NOTE — Telephone Encounter (Signed)
-----   Message from Curt Bears, MD sent at 01/24/2015  3:11 PM EDT ----- Call patient with the result and encourage K rich diet

## 2015-01-25 NOTE — Telephone Encounter (Signed)
I left this message on Hector Rice's voice mail.

## 2015-01-29 NOTE — Patient Instructions (Signed)
Continue labs as scheduled and follow-up in 2 weeks prior to start of your next scheduled cycle of chemotherapy

## 2015-01-31 ENCOUNTER — Other Ambulatory Visit (HOSPITAL_BASED_OUTPATIENT_CLINIC_OR_DEPARTMENT_OTHER): Payer: Medicare Other

## 2015-01-31 DIAGNOSIS — C7A8 Other malignant neuroendocrine tumors: Secondary | ICD-10-CM

## 2015-01-31 DIAGNOSIS — C801 Malignant (primary) neoplasm, unspecified: Secondary | ICD-10-CM

## 2015-01-31 DIAGNOSIS — C7B8 Other secondary neuroendocrine tumors: Secondary | ICD-10-CM

## 2015-01-31 LAB — COMPREHENSIVE METABOLIC PANEL (CC13)
ALBUMIN: 3.1 g/dL — AB (ref 3.5–5.0)
ALT: 21 U/L (ref 0–55)
ANION GAP: 10 meq/L (ref 3–11)
AST: 22 U/L (ref 5–34)
Alkaline Phosphatase: 174 U/L — ABNORMAL HIGH (ref 40–150)
BUN: 14.4 mg/dL (ref 7.0–26.0)
CO2: 29 meq/L (ref 22–29)
Calcium: 9.2 mg/dL (ref 8.4–10.4)
Chloride: 94 mEq/L — ABNORMAL LOW (ref 98–109)
Creatinine: 0.9 mg/dL (ref 0.7–1.3)
EGFR: 87 mL/min/{1.73_m2} — AB (ref >90)
Glucose: 84 mg/dl (ref 70–140)
Potassium: 3.5 mEq/L (ref 3.5–5.1)
Sodium: 133 mEq/L — ABNORMAL LOW (ref 136–145)
Total Bilirubin: 0.22 mg/dL (ref 0.20–1.20)
Total Protein: 6.5 g/dL (ref 6.4–8.3)

## 2015-01-31 LAB — CBC WITH DIFFERENTIAL/PLATELET
BASO%: 0.7 % (ref 0.0–2.0)
BASOS ABS: 0.1 10*3/uL (ref 0.0–0.1)
EOS%: 0.1 % (ref 0.0–7.0)
Eosinophils Absolute: 0 10*3/uL (ref 0.0–0.5)
HCT: 34.4 % — ABNORMAL LOW (ref 38.4–49.9)
HEMOGLOBIN: 11.5 g/dL — AB (ref 13.0–17.1)
LYMPH#: 1.9 10*3/uL (ref 0.9–3.3)
LYMPH%: 10.6 % — ABNORMAL LOW (ref 14.0–49.0)
MCH: 28.4 pg (ref 27.2–33.4)
MCHC: 33.3 g/dL (ref 32.0–36.0)
MCV: 85.1 fL (ref 79.3–98.0)
MONO#: 2.1 10*3/uL — ABNORMAL HIGH (ref 0.1–0.9)
MONO%: 11.8 % (ref 0.0–14.0)
NEUT#: 13.5 10*3/uL — ABNORMAL HIGH (ref 1.5–6.5)
NEUT%: 76.8 % — ABNORMAL HIGH (ref 39.0–75.0)
Platelets: 144 10*3/uL (ref 140–400)
RBC: 4.05 10*6/uL — ABNORMAL LOW (ref 4.20–5.82)
RDW: 12.4 % (ref 11.0–14.6)
WBC: 17.6 10*3/uL — ABNORMAL HIGH (ref 4.0–10.3)

## 2015-02-02 ENCOUNTER — Encounter: Payer: Self-pay | Admitting: *Deleted

## 2015-02-02 NOTE — Progress Notes (Signed)
[place] Psychosocial Distress Screening Clinical Social Work  Clinical Social Work was referred by distress screening protocol.  The patient scored a 5 on the Psychosocial Distress Thermometer which indicates moderate distress. Patient was seen by [facility] and all needs and concerns were addressed.  CSW available to follow up as needed.     ONCBCN DISTRESS SCREENING 01/24/2015  Screening Type Initial Screening  Distress experienced in past week (1-10) 5  Practical problem type Housing;Transportation;Food  Family Problem type Children  Emotional problem type Nervousness/Anxiety;Isolation/feeling alone  Spiritual/Religous concerns type Loss of sense of purpose  Information Concerns Type Lack of info about diagnosis;Lack of info about complementary therapy choices;Lack of info about treatment  Physical Problem type Pain;Nausea/vomiting;Sleep/insomnia;Breathing;Loss of appetitie;Constipation/diarrhea  Physician notified of physical symptoms Yes  Referral to clinical psychology No  Referral to clinical social work No  Referral to dietition No  Referral to financial advocate No  Referral to support programs No  Referral to palliative care No   Johnnye Lana, MSW, LCSW, OSW-C Clinical Social Worker [place] [phone]

## 2015-02-07 ENCOUNTER — Other Ambulatory Visit (HOSPITAL_BASED_OUTPATIENT_CLINIC_OR_DEPARTMENT_OTHER): Payer: Medicare Other

## 2015-02-07 ENCOUNTER — Ambulatory Visit: Payer: Medicare Other | Admitting: Nutrition

## 2015-02-07 ENCOUNTER — Telehealth: Payer: Self-pay | Admitting: Physician Assistant

## 2015-02-07 ENCOUNTER — Encounter: Payer: Self-pay | Admitting: Physician Assistant

## 2015-02-07 ENCOUNTER — Telehealth: Payer: Self-pay | Admitting: *Deleted

## 2015-02-07 ENCOUNTER — Ambulatory Visit (HOSPITAL_BASED_OUTPATIENT_CLINIC_OR_DEPARTMENT_OTHER): Payer: Medicare Other | Admitting: Physician Assistant

## 2015-02-07 ENCOUNTER — Ambulatory Visit (HOSPITAL_BASED_OUTPATIENT_CLINIC_OR_DEPARTMENT_OTHER): Payer: Medicare Other

## 2015-02-07 VITALS — BP 149/84 | HR 106 | Temp 98.4°F | Resp 18 | Ht 69.0 in | Wt 116.7 lb

## 2015-02-07 DIAGNOSIS — C7A8 Other malignant neuroendocrine tumors: Secondary | ICD-10-CM | POA: Diagnosis not present

## 2015-02-07 DIAGNOSIS — C801 Malignant (primary) neoplasm, unspecified: Secondary | ICD-10-CM

## 2015-02-07 DIAGNOSIS — Z5111 Encounter for antineoplastic chemotherapy: Secondary | ICD-10-CM | POA: Diagnosis present

## 2015-02-07 DIAGNOSIS — C3491 Malignant neoplasm of unspecified part of right bronchus or lung: Secondary | ICD-10-CM

## 2015-02-07 DIAGNOSIS — C3490 Malignant neoplasm of unspecified part of unspecified bronchus or lung: Secondary | ICD-10-CM

## 2015-02-07 LAB — CBC WITH DIFFERENTIAL/PLATELET
BASO%: 0.3 % (ref 0.0–2.0)
BASOS ABS: 0.1 10*3/uL (ref 0.0–0.1)
EOS%: 0.5 % (ref 0.0–7.0)
Eosinophils Absolute: 0.1 10*3/uL (ref 0.0–0.5)
HCT: 32.1 % — ABNORMAL LOW (ref 38.4–49.9)
HGB: 11.1 g/dL — ABNORMAL LOW (ref 13.0–17.1)
LYMPH#: 1.9 10*3/uL (ref 0.9–3.3)
LYMPH%: 12.5 % — AB (ref 14.0–49.0)
MCH: 28.9 pg (ref 27.2–33.4)
MCHC: 34.6 g/dL (ref 32.0–36.0)
MCV: 83.6 fL (ref 79.3–98.0)
MONO#: 1.9 10*3/uL — ABNORMAL HIGH (ref 0.1–0.9)
MONO%: 12.9 % (ref 0.0–14.0)
NEUT%: 73.8 % (ref 39.0–75.0)
NEUTROS ABS: 11.1 10*3/uL — AB (ref 1.5–6.5)
NRBC: 0 % (ref 0–0)
Platelets: 738 10*3/uL — ABNORMAL HIGH (ref 140–400)
RBC: 3.84 10*6/uL — ABNORMAL LOW (ref 4.20–5.82)
RDW: 12.6 % (ref 11.0–14.6)
WBC: 15.1 10*3/uL — ABNORMAL HIGH (ref 4.0–10.3)

## 2015-02-07 LAB — COMPREHENSIVE METABOLIC PANEL (CC13)
ALT: 13 U/L (ref 0–55)
AST: 20 U/L (ref 5–34)
Albumin: 2.9 g/dL — ABNORMAL LOW (ref 3.5–5.0)
Alkaline Phosphatase: 176 U/L — ABNORMAL HIGH (ref 40–150)
Anion Gap: 10 mEq/L (ref 3–11)
BILIRUBIN TOTAL: 0.25 mg/dL (ref 0.20–1.20)
BUN: 14.8 mg/dL (ref 7.0–26.0)
CO2: 28 meq/L (ref 22–29)
Calcium: 9.4 mg/dL (ref 8.4–10.4)
Chloride: 95 mEq/L — ABNORMAL LOW (ref 98–109)
Creatinine: 0.9 mg/dL (ref 0.7–1.3)
EGFR: 88 mL/min/{1.73_m2} — AB (ref >90)
Glucose: 87 mg/dl (ref 70–140)
Potassium: 3.3 mEq/L — ABNORMAL LOW (ref 3.5–5.1)
Sodium: 133 mEq/L — ABNORMAL LOW (ref 136–145)
TOTAL PROTEIN: 7.2 g/dL (ref 6.4–8.3)

## 2015-02-07 MED ORDER — SODIUM CHLORIDE 0.9 % IV SOLN
Freq: Once | INTRAVENOUS | Status: AC
  Administered 2015-02-07: 14:00:00 via INTRAVENOUS
  Filled 2015-02-07: qty 8

## 2015-02-07 MED ORDER — SODIUM CHLORIDE 0.9 % IV SOLN
380.0000 mg | Freq: Once | INTRAVENOUS | Status: AC
  Administered 2015-02-07: 380 mg via INTRAVENOUS
  Filled 2015-02-07: qty 38

## 2015-02-07 MED ORDER — SODIUM CHLORIDE 0.9 % IV SOLN
Freq: Once | INTRAVENOUS | Status: AC
  Administered 2015-02-07: 14:00:00 via INTRAVENOUS

## 2015-02-07 MED ORDER — SODIUM CHLORIDE 0.9 % IV SOLN
100.0000 mg/m2 | Freq: Once | INTRAVENOUS | Status: AC
  Administered 2015-02-07: 160 mg via INTRAVENOUS
  Filled 2015-02-07: qty 8

## 2015-02-07 NOTE — Progress Notes (Signed)
Nutrition follow up completed with patient receiving treatment for lung cancer. Patient's weight improved and documented as 116.7 pounds on July 13 from 112 pounds on June 29. He continues to live at an assisted living. Patient denies nausea and vomiting, constipation, or diarrhea. He continues oral nutrition supplements.  Nutrition Diagnosis:  Malnutrition improved.  Intervention:  Educated patient to continue strategies to increase calories and protein throughout the day. Provided support and encouragement to continue increased intake. Questions answered and teach back method used.  Monitoring, Evaluation, Goals:  Patient will tolerate increased calories and protein to promote weight maintenance/gain.  Next Visit:  Thursday, August 4 during infusion.
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
## 2015-02-07 NOTE — Telephone Encounter (Signed)
Per staff message and POF I have scheduled appts. Advised scheduler of appts. JMW  

## 2015-02-07 NOTE — Patient Instructions (Signed)
Continue with labs and chemotherapy as scheduled Follow up in 3 weeks with a restaging CT scan of the chest and abdomen to re-evaluate your disease prior to the start of your next scheduled cycle of chemotherapy

## 2015-02-07 NOTE — Telephone Encounter (Signed)
per pof tos ch pt appt-gave pt copy of avs-sent mw email to sch trmt-gave contrast

## 2015-02-07 NOTE — Patient Instructions (Signed)
[facility] Discharge Instructions for Patients Receiving Chemotherapy  Today you received the following chemotherapy agents: Carboplatin, Etoposide  To help prevent nausea and vomiting after your treatment, we encourage you to take your nausea medication as prescribed by your physician.   If you develop nausea and vomiting that is not controlled by your nausea medication, call the clinic.   BELOW ARE SYMPTOMS THAT SHOULD BE REPORTED IMMEDIATELY:  *FEVER GREATER THAN 100.5 F  *CHILLS WITH OR WITHOUT FEVER  NAUSEA AND VOMITING THAT IS NOT CONTROLLED WITH YOUR NAUSEA MEDICATION  *UNUSUAL SHORTNESS OF BREATH  *UNUSUAL BRUISING OR BLEEDING  TENDERNESS IN MOUTH AND THROAT WITH OR WITHOUT PRESENCE OF ULCERS  *URINARY PROBLEMS  *BOWEL PROBLEMS  UNUSUAL RASH Items with * indicate a potential emergency and should be followed up as soon as possible.  Feel free to call the clinic you have any questions or concerns. The clinic phone number is [phone].  Please show the [place] at check-in to the Emergency Department and triage nurse.

## 2015-02-08 ENCOUNTER — Ambulatory Visit (HOSPITAL_BASED_OUTPATIENT_CLINIC_OR_DEPARTMENT_OTHER): Payer: Medicare Other

## 2015-02-08 VITALS — BP 158/72 | HR 95 | Temp 97.8°F | Resp 18

## 2015-02-08 DIAGNOSIS — Z5111 Encounter for antineoplastic chemotherapy: Secondary | ICD-10-CM

## 2015-02-08 DIAGNOSIS — C7A8 Other malignant neuroendocrine tumors: Secondary | ICD-10-CM

## 2015-02-08 DIAGNOSIS — C801 Malignant (primary) neoplasm, unspecified: Secondary | ICD-10-CM

## 2015-02-08 DIAGNOSIS — C3491 Malignant neoplasm of unspecified part of right bronchus or lung: Secondary | ICD-10-CM

## 2015-02-08 MED ORDER — PROCHLORPERAZINE MALEATE 10 MG PO TABS
10.0000 mg | ORAL_TABLET | Freq: Once | ORAL | Status: AC
  Administered 2015-02-08: 10 mg via ORAL

## 2015-02-08 MED ORDER — SODIUM CHLORIDE 0.9 % IV SOLN
Freq: Once | INTRAVENOUS | Status: AC
  Administered 2015-02-08: 14:00:00 via INTRAVENOUS

## 2015-02-08 MED ORDER — SODIUM CHLORIDE 0.9 % IV SOLN
100.0000 mg/m2 | Freq: Once | INTRAVENOUS | Status: AC
  Administered 2015-02-08: 160 mg via INTRAVENOUS
  Filled 2015-02-08: qty 8

## 2015-02-08 MED ORDER — PROCHLORPERAZINE MALEATE 10 MG PO TABS
ORAL_TABLET | ORAL | Status: AC
  Filled 2015-02-08: qty 1

## 2015-02-08 NOTE — Patient Instructions (Signed)
[facility] Discharge Instructions for Patients Receiving Chemotherapy  Today you received the following chemotherapy agents Etoposide  To help prevent nausea and vomiting after your treatment, we encourage you to take your nausea medication as needed   If you develop nausea and vomiting that is not controlled by your nausea medication, call the clinic.   BELOW ARE SYMPTOMS THAT SHOULD BE REPORTED IMMEDIATELY:  *FEVER GREATER THAN 100.5 F  *CHILLS WITH OR WITHOUT FEVER  NAUSEA AND VOMITING THAT IS NOT CONTROLLED WITH YOUR NAUSEA MEDICATION  *UNUSUAL SHORTNESS OF BREATH  *UNUSUAL BRUISING OR BLEEDING  TENDERNESS IN MOUTH AND THROAT WITH OR WITHOUT PRESENCE OF ULCERS  *URINARY PROBLEMS  *BOWEL PROBLEMS  UNUSUAL RASH Items with * indicate a potential emergency and should be followed up as soon as possible.  Feel free to call the clinic you have any questions or concerns. The clinic phone number is [phone].  Please show the CHEMO ALERT CARD at check-in to the Emergency Department and triage nurse.   

## 2015-02-09 ENCOUNTER — Ambulatory Visit (HOSPITAL_BASED_OUTPATIENT_CLINIC_OR_DEPARTMENT_OTHER): Payer: Medicare Other

## 2015-02-09 ENCOUNTER — Ambulatory Visit: Payer: Medicare Other

## 2015-02-09 VITALS — BP 144/71 | HR 109 | Temp 98.1°F | Resp 18

## 2015-02-09 DIAGNOSIS — C7B8 Other secondary neuroendocrine tumors: Secondary | ICD-10-CM | POA: Diagnosis not present

## 2015-02-09 DIAGNOSIS — C7A8 Other malignant neuroendocrine tumors: Secondary | ICD-10-CM

## 2015-02-09 DIAGNOSIS — C3491 Malignant neoplasm of unspecified part of right bronchus or lung: Secondary | ICD-10-CM

## 2015-02-09 DIAGNOSIS — Z5111 Encounter for antineoplastic chemotherapy: Secondary | ICD-10-CM | POA: Diagnosis present

## 2015-02-09 DIAGNOSIS — Z5189 Encounter for other specified aftercare: Secondary | ICD-10-CM

## 2015-02-09 DIAGNOSIS — C801 Malignant (primary) neoplasm, unspecified: Secondary | ICD-10-CM

## 2015-02-09 MED ORDER — PROCHLORPERAZINE MALEATE 10 MG PO TABS
ORAL_TABLET | ORAL | Status: AC
  Filled 2015-02-09: qty 1

## 2015-02-09 MED ORDER — PROCHLORPERAZINE MALEATE 10 MG PO TABS
10.0000 mg | ORAL_TABLET | Freq: Once | ORAL | Status: AC
  Administered 2015-02-09: 10 mg via ORAL

## 2015-02-09 MED ORDER — PEGFILGRASTIM 6 MG/0.6ML ~~LOC~~ PSKT
6.0000 mg | PREFILLED_SYRINGE | Freq: Once | SUBCUTANEOUS | Status: AC
  Administered 2015-02-09: 6 mg via SUBCUTANEOUS
  Filled 2015-02-09: qty 0.6

## 2015-02-09 MED ORDER — SODIUM CHLORIDE 0.9 % IV SOLN
100.0000 mg/m2 | Freq: Once | INTRAVENOUS | Status: AC
  Administered 2015-02-09: 160 mg via INTRAVENOUS
  Filled 2015-02-09: qty 8

## 2015-02-09 MED ORDER — SODIUM CHLORIDE 0.9 % IV SOLN
Freq: Once | INTRAVENOUS | Status: AC
  Administered 2015-02-09: 14:00:00 via INTRAVENOUS

## 2015-02-09 NOTE — Patient Instructions (Signed)
[facility] Discharge Instructions for Patients Receiving Chemotherapy  Today you received the following chemotherapy agents: Etoposide  To help prevent nausea and vomiting after your treatment, we encourage you to take your nausea medication as prescribed by your physician.   If you develop nausea and vomiting that is not controlled by your nausea medication, call the clinic.   BELOW ARE SYMPTOMS THAT SHOULD BE REPORTED IMMEDIATELY:  *FEVER GREATER THAN 100.5 F  *CHILLS WITH OR WITHOUT FEVER  NAUSEA AND VOMITING THAT IS NOT CONTROLLED WITH YOUR NAUSEA MEDICATION  *UNUSUAL SHORTNESS OF BREATH  *UNUSUAL BRUISING OR BLEEDING  TENDERNESS IN MOUTH AND THROAT WITH OR WITHOUT PRESENCE OF ULCERS  *URINARY PROBLEMS  *BOWEL PROBLEMS  UNUSUAL RASH Items with * indicate a potential emergency and should be followed up as soon as possible.  Feel free to call the clinic you have any questions or concerns. The clinic phone number is [phone].  Please show the CHEMO ALERT CARD at check-in to the Emergency Department and triage nurse.   

## 2015-02-20 ENCOUNTER — Encounter: Payer: Self-pay | Admitting: Internal Medicine

## 2015-02-20 ENCOUNTER — Other Ambulatory Visit: Payer: Self-pay | Admitting: Family

## 2015-02-23 ENCOUNTER — Telehealth: Payer: Self-pay | Admitting: *Deleted

## 2015-02-23 ENCOUNTER — Telehealth: Payer: Self-pay | Admitting: Internal Medicine

## 2015-02-23 NOTE — Telephone Encounter (Signed)
Per scheduler I have moved appts on 8/4 and 8/5 to earlier in the day due to transportation

## 2015-02-23 NOTE — Telephone Encounter (Signed)
MW chgd pt inf per req per pt transportation-gave updated avs

## 2015-02-26 ENCOUNTER — Ambulatory Visit (HOSPITAL_COMMUNITY)
Admission: RE | Admit: 2015-02-26 | Discharge: 2015-02-26 | Disposition: A | Discharge status: Home / Self Care | Payer: Medicare Other | Source: Ambulatory Visit | Attending: Physician Assistant | Admitting: Physician Assistant

## 2015-02-26 ENCOUNTER — Encounter (HOSPITAL_COMMUNITY): Payer: Self-pay

## 2015-02-26 DIAGNOSIS — K4090 Unilateral inguinal hernia, without obstruction or gangrene, not specified as recurrent: Secondary | ICD-10-CM | POA: Insufficient documentation

## 2015-02-26 DIAGNOSIS — I714 Abdominal aortic aneurysm, without rupture: Secondary | ICD-10-CM | POA: Diagnosis not present

## 2015-02-26 DIAGNOSIS — K869 Disease of pancreas, unspecified: Secondary | ICD-10-CM | POA: Diagnosis not present

## 2015-02-26 DIAGNOSIS — J432 Centrilobular emphysema: Secondary | ICD-10-CM | POA: Insufficient documentation

## 2015-02-26 DIAGNOSIS — R05 Cough: Secondary | ICD-10-CM | POA: Diagnosis not present

## 2015-02-26 DIAGNOSIS — I2510 Atherosclerotic heart disease of native coronary artery without angina pectoris: Secondary | ICD-10-CM | POA: Diagnosis not present

## 2015-02-26 DIAGNOSIS — N261 Atrophy of kidney (terminal): Secondary | ICD-10-CM | POA: Insufficient documentation

## 2015-02-26 DIAGNOSIS — R112 Nausea with vomiting, unspecified: Secondary | ICD-10-CM | POA: Insufficient documentation

## 2015-02-26 DIAGNOSIS — C3490 Malignant neoplasm of unspecified part of unspecified bronchus or lung: Secondary | ICD-10-CM

## 2015-02-26 DIAGNOSIS — C801 Malignant (primary) neoplasm, unspecified: Secondary | ICD-10-CM | POA: Diagnosis not present

## 2015-02-26 DIAGNOSIS — I700 Atherosclerosis of aorta: Secondary | ICD-10-CM | POA: Insufficient documentation

## 2015-02-26 DIAGNOSIS — R911 Solitary pulmonary nodule: Secondary | ICD-10-CM | POA: Diagnosis not present

## 2015-02-26 DIAGNOSIS — C787 Secondary malignant neoplasm of liver and intrahepatic bile duct: Secondary | ICD-10-CM | POA: Insufficient documentation

## 2015-02-26 HISTORY — DX: Malignant (primary) neoplasm, unspecified: C80.1

## 2015-02-26 MED ORDER — IOHEXOL 300 MG/ML  SOLN
100.0000 mL | Freq: Once | INTRAMUSCULAR | Status: AC | PRN
  Administered 2015-02-26: 100 mL via INTRAVENOUS

## 2015-02-28 ENCOUNTER — Ambulatory Visit (HOSPITAL_BASED_OUTPATIENT_CLINIC_OR_DEPARTMENT_OTHER): Payer: Medicare Other

## 2015-02-28 ENCOUNTER — Ambulatory Visit (HOSPITAL_BASED_OUTPATIENT_CLINIC_OR_DEPARTMENT_OTHER): Payer: Medicare Other | Admitting: Physician Assistant

## 2015-02-28 ENCOUNTER — Other Ambulatory Visit (HOSPITAL_BASED_OUTPATIENT_CLINIC_OR_DEPARTMENT_OTHER): Payer: Medicare Other

## 2015-02-28 ENCOUNTER — Encounter: Payer: Self-pay | Admitting: Physician Assistant

## 2015-02-28 ENCOUNTER — Telehealth: Payer: Self-pay | Admitting: Internal Medicine

## 2015-02-28 VITALS — BP 133/75 | HR 104 | Temp 97.7°F | Resp 18 | Ht 69.0 in | Wt 115.7 lb

## 2015-02-28 DIAGNOSIS — C7A8 Other malignant neuroendocrine tumors: Secondary | ICD-10-CM

## 2015-02-28 DIAGNOSIS — C801 Malignant (primary) neoplasm, unspecified: Secondary | ICD-10-CM

## 2015-02-28 DIAGNOSIS — Z5111 Encounter for antineoplastic chemotherapy: Secondary | ICD-10-CM

## 2015-02-28 DIAGNOSIS — C3490 Malignant neoplasm of unspecified part of unspecified bronchus or lung: Secondary | ICD-10-CM

## 2015-02-28 DIAGNOSIS — C3491 Malignant neoplasm of unspecified part of right bronchus or lung: Secondary | ICD-10-CM

## 2015-02-28 DIAGNOSIS — C7B8 Other secondary neuroendocrine tumors: Secondary | ICD-10-CM

## 2015-02-28 LAB — COMPREHENSIVE METABOLIC PANEL (CC13)
ALBUMIN: 3.2 g/dL — AB (ref 3.5–5.0)
ALK PHOS: 170 U/L — AB (ref 40–150)
ALT: 17 U/L (ref 0–55)
ANION GAP: 8 meq/L (ref 3–11)
AST: 18 U/L (ref 5–34)
BUN: 14.2 mg/dL (ref 7.0–26.0)
CALCIUM: 9.4 mg/dL (ref 8.4–10.4)
CO2: 31 mEq/L — ABNORMAL HIGH (ref 22–29)
Chloride: 91 mEq/L — ABNORMAL LOW (ref 98–109)
Creatinine: 0.9 mg/dL (ref 0.7–1.3)
EGFR: 86 mL/min/{1.73_m2} — ABNORMAL LOW (ref >90)
GLUCOSE: 98 mg/dL (ref 70–140)
POTASSIUM: 3.3 meq/L — AB (ref 3.5–5.1)
Sodium: 130 mEq/L — ABNORMAL LOW (ref 136–145)
Total Bilirubin: 0.27 mg/dL (ref 0.20–1.20)
Total Protein: 7 g/dL (ref 6.4–8.3)

## 2015-02-28 LAB — CBC WITH DIFFERENTIAL/PLATELET
BASO%: 0.2 % (ref 0.0–2.0)
Basophils Absolute: 0 10*3/uL (ref 0.0–0.1)
EOS%: 1.1 % (ref 0.0–7.0)
Eosinophils Absolute: 0.2 10*3/uL (ref 0.0–0.5)
HCT: 31.2 % — ABNORMAL LOW (ref 38.4–49.9)
HGB: 10.6 g/dL — ABNORMAL LOW (ref 13.0–17.1)
LYMPH#: 1.9 10*3/uL (ref 0.9–3.3)
LYMPH%: 11.9 % — ABNORMAL LOW (ref 14.0–49.0)
MCH: 29 pg (ref 27.2–33.4)
MCHC: 34 g/dL (ref 32.0–36.0)
MCV: 85.5 fL (ref 79.3–98.0)
MONO#: 1.9 10*3/uL — ABNORMAL HIGH (ref 0.1–0.9)
MONO%: 11.5 % (ref 0.0–14.0)
NEUT%: 75.3 % — ABNORMAL HIGH (ref 39.0–75.0)
NEUTROS ABS: 12.1 10*3/uL — AB (ref 1.5–6.5)
Platelets: 458 10*3/uL — ABNORMAL HIGH (ref 140–400)
RBC: 3.65 10*6/uL — AB (ref 4.20–5.82)
RDW: 15.7 % — AB (ref 11.0–14.6)
WBC: 16.1 10*3/uL — AB (ref 4.0–10.3)

## 2015-02-28 MED ORDER — DEXAMETHASONE SODIUM PHOSPHATE 100 MG/10ML IJ SOLN
Freq: Once | INTRAMUSCULAR | Status: AC
  Administered 2015-02-28: 14:00:00 via INTRAVENOUS
  Filled 2015-02-28: qty 8

## 2015-02-28 MED ORDER — SODIUM CHLORIDE 0.9 % IV SOLN
Freq: Once | INTRAVENOUS | Status: AC
  Administered 2015-02-28: 14:00:00 via INTRAVENOUS

## 2015-02-28 MED ORDER — SODIUM CHLORIDE 0.9 % IV SOLN
380.0000 mg | Freq: Once | INTRAVENOUS | Status: AC
  Administered 2015-02-28: 380 mg via INTRAVENOUS
  Filled 2015-02-28: qty 38

## 2015-02-28 MED ORDER — SODIUM CHLORIDE 0.9 % IV SOLN
100.0000 mg/m2 | Freq: Once | INTRAVENOUS | Status: AC
  Administered 2015-02-28: 160 mg via INTRAVENOUS
  Filled 2015-02-28: qty 8

## 2015-02-28 NOTE — Patient Instructions (Signed)
[facility] Discharge Instructions for Patients Receiving Chemotherapy  Today you received the following chemotherapy agents carboplatin and vepesid  To help prevent nausea and vomiting after your treatment, we encourage you to take your nausea medication.   If you develop nausea and vomiting that is not controlled by your nausea medication, call the clinic.   BELOW ARE SYMPTOMS THAT SHOULD BE REPORTED IMMEDIATELY:  *FEVER GREATER THAN 100.5 F  *CHILLS WITH OR WITHOUT FEVER  NAUSEA AND VOMITING THAT IS NOT CONTROLLED WITH YOUR NAUSEA MEDICATION  *UNUSUAL SHORTNESS OF BREATH  *UNUSUAL BRUISING OR BLEEDING  TENDERNESS IN MOUTH AND THROAT WITH OR WITHOUT PRESENCE OF ULCERS  *URINARY PROBLEMS  *BOWEL PROBLEMS  UNUSUAL RASH Items with * indicate a potential emergency and should be followed up as soon as possible.  Feel free to call the clinic you have any questions or concerns. The clinic phone number is [phone].  Please show the [place] at check-in to the Emergency Department and triage nurse.
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
## 2015-02-28 NOTE — Telephone Encounter (Signed)
Added appts....the patient will get sched in chemo

## 2015-02-28 NOTE — Patient Instructions (Signed)
Your recent restaging CT scan showed improvement in your disease Continue labs and chemotherapy as scheduled Follow-up in 3 weeks prior to the start of your next scheduled cycle of chemotherapy

## 2015-03-01 ENCOUNTER — Ambulatory Visit (HOSPITAL_BASED_OUTPATIENT_CLINIC_OR_DEPARTMENT_OTHER): Payer: Medicare Other

## 2015-03-01 ENCOUNTER — Ambulatory Visit: Payer: Medicare Other | Admitting: Nutrition

## 2015-03-01 ENCOUNTER — Encounter: Payer: Medicare Other | Admitting: Nutrition

## 2015-03-01 VITALS — BP 155/81 | HR 103 | Temp 97.5°F

## 2015-03-01 DIAGNOSIS — C7B8 Other secondary neuroendocrine tumors: Secondary | ICD-10-CM

## 2015-03-01 DIAGNOSIS — Z5111 Encounter for antineoplastic chemotherapy: Secondary | ICD-10-CM | POA: Diagnosis not present

## 2015-03-01 DIAGNOSIS — C7A8 Other malignant neuroendocrine tumors: Secondary | ICD-10-CM | POA: Diagnosis not present

## 2015-03-01 DIAGNOSIS — C801 Malignant (primary) neoplasm, unspecified: Secondary | ICD-10-CM

## 2015-03-01 DIAGNOSIS — C3491 Malignant neoplasm of unspecified part of right bronchus or lung: Secondary | ICD-10-CM

## 2015-03-01 MED ORDER — PROCHLORPERAZINE MALEATE 10 MG PO TABS
ORAL_TABLET | ORAL | Status: AC
  Filled 2015-03-01: qty 1

## 2015-03-01 MED ORDER — PROCHLORPERAZINE MALEATE 10 MG PO TABS
10.0000 mg | ORAL_TABLET | Freq: Once | ORAL | Status: AC
  Administered 2015-03-01: 10 mg via ORAL

## 2015-03-01 MED ORDER — SODIUM CHLORIDE 0.9 % IV SOLN
Freq: Once | INTRAVENOUS | Status: AC
  Administered 2015-03-01: 13:00:00 via INTRAVENOUS

## 2015-03-01 MED ORDER — SODIUM CHLORIDE 0.9 % IV SOLN
100.0000 mg/m2 | Freq: Once | INTRAVENOUS | Status: AC
  Administered 2015-03-01: 160 mg via INTRAVENOUS
  Filled 2015-03-01: qty 8

## 2015-03-01 NOTE — Patient Instructions (Signed)
[facility] Discharge Instructions for Patients Receiving Chemotherapy  Today you received the following chemotherapy agents: Etoposide.  To help prevent nausea and vomiting after your treatment, we encourage you to take your nausea medication:Compazine 10 mg every 6 hours as needed.   If you develop nausea and vomiting that is not controlled by your nausea medication, call the clinic.   BELOW ARE SYMPTOMS THAT SHOULD BE REPORTED IMMEDIATELY:  *FEVER GREATER THAN 100.5 F  *CHILLS WITH OR WITHOUT FEVER  NAUSEA AND VOMITING THAT IS NOT CONTROLLED WITH YOUR NAUSEA MEDICATION  *UNUSUAL SHORTNESS OF BREATH  *UNUSUAL BRUISING OR BLEEDING  TENDERNESS IN MOUTH AND THROAT WITH OR WITHOUT PRESENCE OF ULCERS  *URINARY PROBLEMS  *BOWEL PROBLEMS  UNUSUAL RASH Items with * indicate a potential emergency and should be followed up as soon as possible.  Feel free to call the clinic you have any questions or concerns. The clinic phone number is [phone].  Please show the [facility] at check-in to the Emergency Department and triage nurse.

## 2015-03-01 NOTE — Progress Notes (Signed)
Nutrition follow-up completed with patient receiving chemotherapy for lung cancer. Patient continues living in an assisted-living home. Weight relatively stable and documented as 115.7 pounds August 3 decreased from 116.7 pounds July 13. Patient describes swallowing difficulties and nausea. Bowel movements are normal per patient. Patient consumes one oral nutrition supplement daily.  Nutrition diagnosis: Malnutrition continues.  Intervention: Patient educated to increase calories and protein at meals and increase oral nutrition supplements twice a day Educated patient on improving oral intake with difficulty swallowing and nausea. Questions were answered and teach back method used.  Monitoring, evaluation, goals: Patient will tolerate increased calories and protein to minimize loss of lean body mass.  Next visit: Thursday, August 25, during infusion.  **Disclaimer: This note was dictated with voice recognition software. Similar sounding words can inadvertently be transcribed and this note may contain transcription errors which may not have been corrected upon publication of note.**

## 2015-03-02 ENCOUNTER — Ambulatory Visit: Payer: Medicare Other

## 2015-03-02 ENCOUNTER — Ambulatory Visit (HOSPITAL_BASED_OUTPATIENT_CLINIC_OR_DEPARTMENT_OTHER): Payer: Medicare Other

## 2015-03-02 VITALS — BP 144/76 | HR 100 | Temp 97.7°F | Resp 18

## 2015-03-02 DIAGNOSIS — C7B8 Other secondary neuroendocrine tumors: Secondary | ICD-10-CM | POA: Diagnosis not present

## 2015-03-02 DIAGNOSIS — Z5111 Encounter for antineoplastic chemotherapy: Secondary | ICD-10-CM | POA: Diagnosis not present

## 2015-03-02 DIAGNOSIS — C7A8 Other malignant neuroendocrine tumors: Secondary | ICD-10-CM | POA: Diagnosis not present

## 2015-03-02 DIAGNOSIS — C3491 Malignant neoplasm of unspecified part of right bronchus or lung: Secondary | ICD-10-CM

## 2015-03-02 DIAGNOSIS — C801 Malignant (primary) neoplasm, unspecified: Secondary | ICD-10-CM

## 2015-03-02 DIAGNOSIS — Z5189 Encounter for other specified aftercare: Secondary | ICD-10-CM

## 2015-03-02 MED ORDER — PROCHLORPERAZINE MALEATE 10 MG PO TABS
ORAL_TABLET | ORAL | Status: AC
Start: 2015-03-02 — End: 2015-03-02
  Filled 2015-03-02: qty 1

## 2015-03-02 MED ORDER — PEGFILGRASTIM 6 MG/0.6ML ~~LOC~~ PSKT
6.0000 mg | PREFILLED_SYRINGE | Freq: Once | SUBCUTANEOUS | Status: AC
  Administered 2015-03-02: 6 mg via SUBCUTANEOUS
  Filled 2015-03-02: qty 0.6

## 2015-03-02 MED ORDER — ETOPOSIDE CHEMO INJECTION 1 GM/50ML
100.0000 mg/m2 | Freq: Once | INTRAVENOUS | Status: AC
  Administered 2015-03-02: 160 mg via INTRAVENOUS
  Filled 2015-03-02: qty 8

## 2015-03-02 MED ORDER — SODIUM CHLORIDE 0.9 % IV SOLN
Freq: Once | INTRAVENOUS | Status: AC
  Administered 2015-03-02: 13:00:00 via INTRAVENOUS

## 2015-03-02 MED ORDER — PROCHLORPERAZINE MALEATE 10 MG PO TABS
10.0000 mg | ORAL_TABLET | Freq: Once | ORAL | Status: AC
  Administered 2015-03-02: 10 mg via ORAL

## 2015-03-02 NOTE — Patient Instructions (Signed)
[facility] Discharge Instructions for Patients Receiving Chemotherapy  Today you received the following chemotherapy agent: Etoposide   To help prevent nausea and vomiting after your treatment, we encourage you to take your nausea medication as prescribed.    If you develop nausea and vomiting that is not controlled by your nausea medication, call the clinic.   BELOW ARE SYMPTOMS THAT SHOULD BE REPORTED IMMEDIATELY:  *FEVER GREATER THAN 100.5 F  *CHILLS WITH OR WITHOUT FEVER  NAUSEA AND VOMITING THAT IS NOT CONTROLLED WITH YOUR NAUSEA MEDICATION  *UNUSUAL SHORTNESS OF BREATH  *UNUSUAL BRUISING OR BLEEDING  TENDERNESS IN MOUTH AND THROAT WITH OR WITHOUT PRESENCE OF ULCERS  *URINARY PROBLEMS  *BOWEL PROBLEMS  UNUSUAL RASH Items with * indicate a potential emergency and should be followed up as soon as possible.  Feel free to call the clinic you have any questions or concerns. The clinic phone number is [phone].  Please show the CHEMO ALERT CARD at check-in to the Emergency Department and triage nurse.   

## 2015-03-07 ENCOUNTER — Telehealth: Payer: Self-pay | Admitting: *Deleted

## 2015-03-07 ENCOUNTER — Other Ambulatory Visit (HOSPITAL_BASED_OUTPATIENT_CLINIC_OR_DEPARTMENT_OTHER): Payer: Medicare Other

## 2015-03-07 DIAGNOSIS — C7B8 Other secondary neuroendocrine tumors: Secondary | ICD-10-CM | POA: Diagnosis not present

## 2015-03-07 DIAGNOSIS — C7A8 Other malignant neuroendocrine tumors: Secondary | ICD-10-CM | POA: Diagnosis not present

## 2015-03-07 DIAGNOSIS — C801 Malignant (primary) neoplasm, unspecified: Secondary | ICD-10-CM

## 2015-03-07 LAB — CBC WITH DIFFERENTIAL/PLATELET
BASO%: 0.5 % (ref 0.0–2.0)
BASOS ABS: 0.1 10*3/uL (ref 0.0–0.1)
EOS ABS: 0.1 10*3/uL (ref 0.0–0.5)
EOS%: 0.5 % (ref 0.0–7.0)
HEMATOCRIT: 24.7 % — AB (ref 38.4–49.9)
HGB: 8.6 g/dL — ABNORMAL LOW (ref 13.0–17.1)
LYMPH#: 1.1 10*3/uL (ref 0.9–3.3)
LYMPH%: 6.1 % — ABNORMAL LOW (ref 14.0–49.0)
MCH: 30 pg (ref 27.2–33.4)
MCHC: 34.8 g/dL (ref 32.0–36.0)
MCV: 86.1 fL (ref 79.3–98.0)
MONO#: 0.8 10*3/uL (ref 0.1–0.9)
MONO%: 4.4 % (ref 0.0–14.0)
NEUT%: 88.5 % — ABNORMAL HIGH (ref 39.0–75.0)
NEUTROS ABS: 15.8 10*3/uL — AB (ref 1.5–6.5)
Platelets: 383 10*3/uL (ref 140–400)
RBC: 2.88 10*6/uL — ABNORMAL LOW (ref 4.20–5.82)
RDW: 17 % — AB (ref 11.0–14.6)
WBC: 17.8 10*3/uL — ABNORMAL HIGH (ref 4.0–10.3)

## 2015-03-07 LAB — COMPREHENSIVE METABOLIC PANEL (CC13)
ALBUMIN: 3 g/dL — AB (ref 3.5–5.0)
ALT: 14 U/L (ref 0–55)
AST: 17 U/L (ref 5–34)
Alkaline Phosphatase: 211 U/L — ABNORMAL HIGH (ref 40–150)
Anion Gap: 6 mEq/L (ref 3–11)
BUN: 15.5 mg/dL (ref 7.0–26.0)
CO2: 30 meq/L — AB (ref 22–29)
Calcium: 8.7 mg/dL (ref 8.4–10.4)
Chloride: 94 mEq/L — ABNORMAL LOW (ref 98–109)
Creatinine: 0.9 mg/dL (ref 0.7–1.3)
EGFR: 88 mL/min/{1.73_m2} — AB (ref >90)
GLUCOSE: 96 mg/dL (ref 70–140)
Potassium: 3.1 mEq/L — ABNORMAL LOW (ref 3.5–5.1)
SODIUM: 130 meq/L — AB (ref 136–145)
TOTAL PROTEIN: 6.4 g/dL (ref 6.4–8.3)
Total Bilirubin: 0.3 mg/dL (ref 0.20–1.20)

## 2015-03-07 MED ORDER — POTASSIUM CHLORIDE CRYS ER 20 MEQ PO TBCR: 20.0000 meq | EXTENDED_RELEASE_TABLET | Freq: Every day | ORAL | Status: DC

## 2015-03-07 NOTE — Telephone Encounter (Signed)
-----   Message from Curt Bears, MD sent at 03/07/2015  3:05 PM EDT ----- Call patient with the result and order K Dur 20 meq po qd X 7

## 2015-03-07 NOTE — Progress Notes (Signed)
Quick Note:  Call patient with the result and order K Dur 20 meq po qd X 7 ______ 

## 2015-03-07 NOTE — Telephone Encounter (Signed)
Notified pt of recent labs. K Dur 20 meq po qd X 7 called into pt pharmacy. Pt aware.

## 2015-03-14 ENCOUNTER — Other Ambulatory Visit (HOSPITAL_BASED_OUTPATIENT_CLINIC_OR_DEPARTMENT_OTHER): Payer: Medicare Other

## 2015-03-14 DIAGNOSIS — C801 Malignant (primary) neoplasm, unspecified: Secondary | ICD-10-CM

## 2015-03-14 DIAGNOSIS — C7A8 Other malignant neuroendocrine tumors: Secondary | ICD-10-CM | POA: Diagnosis not present

## 2015-03-14 DIAGNOSIS — C7B8 Other secondary neuroendocrine tumors: Secondary | ICD-10-CM

## 2015-03-14 LAB — CBC WITH DIFFERENTIAL/PLATELET
BASO%: 0.3 % (ref 0.0–2.0)
Basophils Absolute: 0.1 10*3/uL (ref 0.0–0.1)
EOS ABS: 0.1 10*3/uL (ref 0.0–0.5)
EOS%: 0.5 % (ref 0.0–7.0)
HCT: 27.5 % — ABNORMAL LOW (ref 38.4–49.9)
HGB: 9.4 g/dL — ABNORMAL LOW (ref 13.0–17.1)
LYMPH%: 9.6 % — ABNORMAL LOW (ref 14.0–49.0)
MCH: 30 pg (ref 27.2–33.4)
MCHC: 34.2 g/dL (ref 32.0–36.0)
MCV: 87.9 fL (ref 79.3–98.0)
MONO#: 2.1 10*3/uL — ABNORMAL HIGH (ref 0.1–0.9)
MONO%: 10.2 % (ref 0.0–14.0)
NEUT#: 16 10*3/uL — ABNORMAL HIGH (ref 1.5–6.5)
NEUT%: 79.4 % — ABNORMAL HIGH (ref 39.0–75.0)
Platelets: 190 10*3/uL (ref 140–400)
RBC: 3.13 10*6/uL — ABNORMAL LOW (ref 4.20–5.82)
RDW: 18.8 % — AB (ref 11.0–14.6)
WBC: 20.2 10*3/uL — ABNORMAL HIGH (ref 4.0–10.3)
lymph#: 1.9 10*3/uL (ref 0.9–3.3)

## 2015-03-14 LAB — COMPREHENSIVE METABOLIC PANEL (CC13)
ALK PHOS: 204 U/L — AB (ref 40–150)
ALT: 14 U/L (ref 0–55)
AST: 15 U/L (ref 5–34)
Albumin: 3.2 g/dL — ABNORMAL LOW (ref 3.5–5.0)
Anion Gap: 12 mEq/L — ABNORMAL HIGH (ref 3–11)
BUN: 18.8 mg/dL (ref 7.0–26.0)
CO2: 27 mEq/L (ref 22–29)
CREATININE: 1.2 mg/dL (ref 0.7–1.3)
Calcium: 9.1 mg/dL (ref 8.4–10.4)
Chloride: 93 mEq/L — ABNORMAL LOW (ref 98–109)
EGFR: 60 mL/min/{1.73_m2} — ABNORMAL LOW (ref >90)
Glucose: 133 mg/dl (ref 70–140)
POTASSIUM: 3.4 meq/L — AB (ref 3.5–5.1)
Sodium: 131 mEq/L — ABNORMAL LOW (ref 136–145)
Total Bilirubin: 0.24 mg/dL (ref 0.20–1.20)
Total Protein: 6.7 g/dL (ref 6.4–8.3)

## 2015-03-21 ENCOUNTER — Encounter: Payer: Self-pay | Admitting: Internal Medicine

## 2015-03-21 ENCOUNTER — Other Ambulatory Visit: Payer: Self-pay | Admitting: Medical Oncology

## 2015-03-21 ENCOUNTER — Ambulatory Visit (HOSPITAL_BASED_OUTPATIENT_CLINIC_OR_DEPARTMENT_OTHER): Payer: Medicare Other | Admitting: Internal Medicine

## 2015-03-21 ENCOUNTER — Other Ambulatory Visit (HOSPITAL_BASED_OUTPATIENT_CLINIC_OR_DEPARTMENT_OTHER): Payer: Medicare Other

## 2015-03-21 ENCOUNTER — Ambulatory Visit (HOSPITAL_BASED_OUTPATIENT_CLINIC_OR_DEPARTMENT_OTHER): Payer: Medicare Other

## 2015-03-21 ENCOUNTER — Telehealth: Payer: Self-pay | Admitting: Internal Medicine

## 2015-03-21 VITALS — BP 146/74 | HR 100 | Temp 98.4°F | Resp 18 | Ht 69.0 in | Wt 118.9 lb

## 2015-03-21 DIAGNOSIS — Z5111 Encounter for antineoplastic chemotherapy: Secondary | ICD-10-CM

## 2015-03-21 DIAGNOSIS — C7B8 Other secondary neuroendocrine tumors: Secondary | ICD-10-CM

## 2015-03-21 DIAGNOSIS — E876 Hypokalemia: Secondary | ICD-10-CM

## 2015-03-21 DIAGNOSIS — C801 Malignant (primary) neoplasm, unspecified: Secondary | ICD-10-CM

## 2015-03-21 DIAGNOSIS — C7A8 Other malignant neuroendocrine tumors: Secondary | ICD-10-CM

## 2015-03-21 DIAGNOSIS — C3491 Malignant neoplasm of unspecified part of right bronchus or lung: Secondary | ICD-10-CM

## 2015-03-21 DIAGNOSIS — C3490 Malignant neoplasm of unspecified part of unspecified bronchus or lung: Secondary | ICD-10-CM

## 2015-03-21 LAB — COMPREHENSIVE METABOLIC PANEL (CC13)
ALT: 14 U/L (ref 0–55)
AST: 16 U/L (ref 5–34)
Albumin: 3.1 g/dL — ABNORMAL LOW (ref 3.5–5.0)
Alkaline Phosphatase: 159 U/L — ABNORMAL HIGH (ref 40–150)
Anion Gap: 11 mEq/L (ref 3–11)
BUN: 13 mg/dL (ref 7.0–26.0)
CO2: 28 meq/L (ref 22–29)
CREATININE: 1 mg/dL (ref 0.7–1.3)
Calcium: 8.9 mg/dL (ref 8.4–10.4)
Chloride: 93 mEq/L — ABNORMAL LOW (ref 98–109)
EGFR: 75 mL/min/{1.73_m2} — ABNORMAL LOW (ref >90)
GLUCOSE: 124 mg/dL (ref 70–140)
Potassium: 3 mEq/L — CL (ref 3.5–5.1)
SODIUM: 132 meq/L — AB (ref 136–145)
Total Bilirubin: 0.2 mg/dL (ref 0.20–1.20)
Total Protein: 6.7 g/dL (ref 6.4–8.3)

## 2015-03-21 LAB — CBC WITH DIFFERENTIAL/PLATELET
BASO%: 0.4 % (ref 0.0–2.0)
Basophils Absolute: 0.1 10*3/uL (ref 0.0–0.1)
EOS%: 0.9 % (ref 0.0–7.0)
Eosinophils Absolute: 0.1 10*3/uL (ref 0.0–0.5)
HCT: 28.4 % — ABNORMAL LOW (ref 38.4–49.9)
HGB: 9.7 g/dL — ABNORMAL LOW (ref 13.0–17.1)
LYMPH%: 14.5 % (ref 14.0–49.0)
MCH: 30.5 pg (ref 27.2–33.4)
MCHC: 34.2 g/dL (ref 32.0–36.0)
MCV: 89.3 fL (ref 79.3–98.0)
MONO#: 1.5 10*3/uL — AB (ref 0.1–0.9)
MONO%: 10.5 % (ref 0.0–14.0)
NEUT%: 73.7 % (ref 39.0–75.0)
NEUTROS ABS: 10.1 10*3/uL — AB (ref 1.5–6.5)
Platelets: 511 10*3/uL — ABNORMAL HIGH (ref 140–400)
RBC: 3.18 10*6/uL — AB (ref 4.20–5.82)
RDW: 19.2 % — ABNORMAL HIGH (ref 11.0–14.6)
WBC: 13.8 10*3/uL — AB (ref 4.0–10.3)
lymph#: 2 10*3/uL (ref 0.9–3.3)

## 2015-03-21 MED ORDER — SODIUM CHLORIDE 0.9 % IV SOLN
337.5000 mg | Freq: Once | INTRAVENOUS | Status: AC
  Administered 2015-03-21: 340 mg via INTRAVENOUS
  Filled 2015-03-21: qty 34

## 2015-03-21 MED ORDER — SODIUM CHLORIDE 0.9 % IV SOLN
Freq: Once | INTRAVENOUS | Status: AC
  Administered 2015-03-21: 15:00:00 via INTRAVENOUS

## 2015-03-21 MED ORDER — SODIUM CHLORIDE 0.9 % IV SOLN
100.0000 mg/m2 | Freq: Once | INTRAVENOUS | Status: AC
  Administered 2015-03-21: 160 mg via INTRAVENOUS
  Filled 2015-03-21: qty 8

## 2015-03-21 MED ORDER — SODIUM CHLORIDE 0.9 % IV SOLN
Freq: Once | INTRAVENOUS | Status: AC
  Administered 2015-03-21: 15:00:00 via INTRAVENOUS
  Filled 2015-03-21: qty 8

## 2015-03-21 MED ORDER — POTASSIUM CHLORIDE CRYS ER 20 MEQ PO TBCR: 20.0000 meq | EXTENDED_RELEASE_TABLET | Freq: Every day | ORAL | Status: DC

## 2015-03-21 NOTE — Telephone Encounter (Signed)
Gave relative avs report and appointments for August thru October. Message to MM re 9/14 f/u - no availability. Also added f/u and cycle for 10/5 to ensure that patient has a f/u on schedule.

## 2015-03-21 NOTE — Patient Instructions (Signed)
[facility] Discharge Instructions for Patients Receiving Chemotherapy  Today you received the following chemotherapy agents Carboplatin/VP 16 (Etoposide) To help prevent nausea and vomiting after your treatment, we encourage you to take your nausea medication as prescribed.  If you develop nausea and vomiting that is not controlled by your nausea medication, call the clinic.   BELOW ARE SYMPTOMS THAT SHOULD BE REPORTED IMMEDIATELY:  *FEVER GREATER THAN 100.5 F  *CHILLS WITH OR WITHOUT FEVER  NAUSEA AND VOMITING THAT IS NOT CONTROLLED WITH YOUR NAUSEA MEDICATION  *UNUSUAL SHORTNESS OF BREATH  *UNUSUAL BRUISING OR BLEEDING  TENDERNESS IN MOUTH AND THROAT WITH OR WITHOUT PRESENCE OF ULCERS  *URINARY PROBLEMS  *BOWEL PROBLEMS  UNUSUAL RASH Items with * indicate a potential emergency and should be followed up as soon as possible.  Feel free to call the clinic you have any questions or concerns. The clinic phone number is [phone].  Please show the [facility] at check-in to the Emergency Department and triage nurse.
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
## 2015-03-22 ENCOUNTER — Ambulatory Visit: Payer: Medicare Other | Admitting: Nutrition

## 2015-03-22 ENCOUNTER — Ambulatory Visit (HOSPITAL_BASED_OUTPATIENT_CLINIC_OR_DEPARTMENT_OTHER): Payer: Medicare Other

## 2015-03-22 VITALS — BP 148/72 | HR 101 | Temp 97.9°F

## 2015-03-22 DIAGNOSIS — Z5111 Encounter for antineoplastic chemotherapy: Secondary | ICD-10-CM

## 2015-03-22 DIAGNOSIS — C801 Malignant (primary) neoplasm, unspecified: Secondary | ICD-10-CM

## 2015-03-22 DIAGNOSIS — C3491 Malignant neoplasm of unspecified part of right bronchus or lung: Secondary | ICD-10-CM

## 2015-03-22 DIAGNOSIS — C7B8 Other secondary neuroendocrine tumors: Secondary | ICD-10-CM | POA: Diagnosis not present

## 2015-03-22 DIAGNOSIS — C7A8 Other malignant neuroendocrine tumors: Secondary | ICD-10-CM | POA: Diagnosis not present

## 2015-03-22 MED ORDER — SODIUM CHLORIDE 0.9 % IV SOLN
Freq: Once | INTRAVENOUS | Status: AC
  Administered 2015-03-22: 14:00:00 via INTRAVENOUS

## 2015-03-22 MED ORDER — PROCHLORPERAZINE MALEATE 10 MG PO TABS
ORAL_TABLET | ORAL | Status: AC
  Filled 2015-03-22: qty 1

## 2015-03-22 MED ORDER — SODIUM CHLORIDE 0.9 % IV SOLN
100.0000 mg/m2 | Freq: Once | INTRAVENOUS | Status: AC
  Administered 2015-03-22: 160 mg via INTRAVENOUS
  Filled 2015-03-22: qty 8

## 2015-03-22 NOTE — Patient Instructions (Signed)
[facility] Discharge Instructions for Patients Receiving Chemotherapy  Today you received the following chemotherapy agent: Etoposide   To help prevent nausea and vomiting after your treatment, we encourage you to take your nausea medication as prescribed.    If you develop nausea and vomiting that is not controlled by your nausea medication, call the clinic.   BELOW ARE SYMPTOMS THAT SHOULD BE REPORTED IMMEDIATELY:  *FEVER GREATER THAN 100.5 F  *CHILLS WITH OR WITHOUT FEVER  NAUSEA AND VOMITING THAT IS NOT CONTROLLED WITH YOUR NAUSEA MEDICATION  *UNUSUAL SHORTNESS OF BREATH  *UNUSUAL BRUISING OR BLEEDING  TENDERNESS IN MOUTH AND THROAT WITH OR WITHOUT PRESENCE OF ULCERS  *URINARY PROBLEMS  *BOWEL PROBLEMS  UNUSUAL RASH Items with * indicate a potential emergency and should be followed up as soon as possible.  Feel free to call the clinic you have any questions or concerns. The clinic phone number is [phone].  Please show the CHEMO ALERT CARD at check-in to the Emergency Department and triage nurse.   

## 2015-03-22 NOTE — Progress Notes (Signed)
Patient had some nausea this am.  Took compazine at 11:30am. - so will hold po compazine with treatment.    Patient's K+ was sent to [facility] and he is actually at [facility].  Called Armanda Magic RN to redirect K+.   Dory Peru here to talk with patient and gave him orange juice to drink.

## 2015-03-22 NOTE — Progress Notes (Signed)
Nutrition follow-up completed with patient during treatment for lung cancer. Patient continues living in an assisted-living home. Weight improved and documented as 118.9 pounds August 24 improved from 115.7 pounds August 3. BMI of 17.55 indicates patient remains underweight. Patient reports improvement in swallowing. He denies issues with nausea, constipation or diarrhea. Patient consumes one oral nutrition supplement daily but declines sample today. Noted potassium was low at 3.0.  RN enforcing importance of patient taking potassium supplement.  Nutrition diagnosis: Malnutrition, improved.  Intervention:  Patient educated to increase calories and protein at mealtimes.   Recommended patient increase oral nutrition supplements twice a day Enforced importance of taking potassium medication as prescribed. Teach back method used.  Monitoring, evaluation, goals:  Patient will work to increase calories and protein to promote continued weight gain.  Next visit: Friday, September 16, during infusion.  **Disclaimer: This note was dictated with voice recognition software. Similar sounding words can inadvertently be transcribed and this note may contain transcription errors which may not have been corrected upon publication of note.**

## 2015-03-23 ENCOUNTER — Ambulatory Visit: Payer: Medicare Other

## 2015-03-23 ENCOUNTER — Ambulatory Visit (HOSPITAL_BASED_OUTPATIENT_CLINIC_OR_DEPARTMENT_OTHER): Payer: Medicare Other

## 2015-03-23 VITALS — BP 144/71 | HR 103 | Temp 97.6°F

## 2015-03-23 DIAGNOSIS — C3491 Malignant neoplasm of unspecified part of right bronchus or lung: Secondary | ICD-10-CM

## 2015-03-23 DIAGNOSIS — C7B8 Other secondary neuroendocrine tumors: Secondary | ICD-10-CM | POA: Diagnosis not present

## 2015-03-23 DIAGNOSIS — Z5111 Encounter for antineoplastic chemotherapy: Secondary | ICD-10-CM

## 2015-03-23 DIAGNOSIS — C801 Malignant (primary) neoplasm, unspecified: Secondary | ICD-10-CM

## 2015-03-23 DIAGNOSIS — C7A8 Other malignant neuroendocrine tumors: Secondary | ICD-10-CM | POA: Diagnosis not present

## 2015-03-23 DIAGNOSIS — Z5189 Encounter for other specified aftercare: Secondary | ICD-10-CM | POA: Diagnosis not present

## 2015-03-23 MED ORDER — PROCHLORPERAZINE MALEATE 10 MG PO TABS
10.0000 mg | ORAL_TABLET | Freq: Once | ORAL | Status: AC
  Administered 2015-03-23: 10 mg via ORAL

## 2015-03-23 MED ORDER — PROCHLORPERAZINE MALEATE 10 MG PO TABS
ORAL_TABLET | ORAL | Status: AC
  Filled 2015-03-23: qty 1

## 2015-03-23 MED ORDER — SODIUM CHLORIDE 0.9 % IV SOLN
Freq: Once | INTRAVENOUS | Status: AC
  Administered 2015-03-23: 13:00:00 via INTRAVENOUS

## 2015-03-23 MED ORDER — SODIUM CHLORIDE 0.9 % IV SOLN
100.0000 mg/m2 | Freq: Once | INTRAVENOUS | Status: AC
  Administered 2015-03-23: 160 mg via INTRAVENOUS
  Filled 2015-03-23: qty 8

## 2015-03-23 MED ORDER — PEGFILGRASTIM 6 MG/0.6ML ~~LOC~~ PSKT
6.0000 mg | PREFILLED_SYRINGE | Freq: Once | SUBCUTANEOUS | Status: AC
  Administered 2015-03-23: 6 mg via SUBCUTANEOUS
  Filled 2015-03-23: qty 0.6

## 2015-03-23 NOTE — Patient Instructions (Signed)
[facility] Discharge Instructions for Patients Receiving Chemotherapy  Today you received the following chemotherapy agents Etoposide.   To help prevent nausea and vomiting after your treatment, we encourage you to take your nausea medication as prescribed.   If you develop nausea and vomiting that is not controlled by your nausea medication, call the clinic.   BELOW ARE SYMPTOMS THAT SHOULD BE REPORTED IMMEDIATELY:  *FEVER GREATER THAN 100.5 F  *CHILLS WITH OR WITHOUT FEVER  NAUSEA AND VOMITING THAT IS NOT CONTROLLED WITH YOUR NAUSEA MEDICATION  *UNUSUAL SHORTNESS OF BREATH  *UNUSUAL BRUISING OR BLEEDING  TENDERNESS IN MOUTH AND THROAT WITH OR WITHOUT PRESENCE OF ULCERS  *URINARY PROBLEMS  *BOWEL PROBLEMS  UNUSUAL RASH Items with * indicate a potential emergency and should be followed up as soon as possible.  Feel free to call the clinic you have any questions or concerns. The clinic phone number is [phone].  Please show the CHEMO ALERT CARD at check-in to the Emergency Department and triage nurse.   

## 2015-03-26 ENCOUNTER — Encounter: Payer: Self-pay | Admitting: General Practice

## 2015-03-26 NOTE — Progress Notes (Signed)
Spiritual Care Note  Attempting to reach Mr Llamas for f/u support per family request.  Per instructions in chart, left message for pt's daughter, Genevie Cheshire.  Also left message of support for daughter Drucie Opitz, who met me in chemo class with pt and made earlier request for spiritual care.  Please also page as needs arise.  Thank you.  [place], [place] Pager [phone] Voicemail  [phone]

## 2015-03-28 ENCOUNTER — Other Ambulatory Visit (HOSPITAL_BASED_OUTPATIENT_CLINIC_OR_DEPARTMENT_OTHER): Payer: Medicare Other

## 2015-03-28 ENCOUNTER — Other Ambulatory Visit: Payer: Self-pay | Admitting: Medical Oncology

## 2015-03-28 ENCOUNTER — Telehealth: Payer: Self-pay | Admitting: Medical Oncology

## 2015-03-28 DIAGNOSIS — C7B8 Other secondary neuroendocrine tumors: Secondary | ICD-10-CM | POA: Diagnosis not present

## 2015-03-28 DIAGNOSIS — C7A8 Other malignant neuroendocrine tumors: Secondary | ICD-10-CM | POA: Diagnosis present

## 2015-03-28 DIAGNOSIS — C3490 Malignant neoplasm of unspecified part of unspecified bronchus or lung: Secondary | ICD-10-CM

## 2015-03-28 DIAGNOSIS — E876 Hypokalemia: Secondary | ICD-10-CM

## 2015-03-28 LAB — COMPREHENSIVE METABOLIC PANEL (CC13)
ALBUMIN: 2.8 g/dL — AB (ref 3.5–5.0)
ALT: 10 U/L (ref 0–55)
ANION GAP: 8 meq/L (ref 3–11)
AST: 15 U/L (ref 5–34)
Alkaline Phosphatase: 182 U/L — ABNORMAL HIGH (ref 40–150)
BILIRUBIN TOTAL: 0.35 mg/dL (ref 0.20–1.20)
BUN: 14.6 mg/dL (ref 7.0–26.0)
CALCIUM: 8.7 mg/dL (ref 8.4–10.4)
CO2: 30 mEq/L — ABNORMAL HIGH (ref 22–29)
CREATININE: 0.8 mg/dL (ref 0.7–1.3)
Chloride: 96 mEq/L — ABNORMAL LOW (ref 98–109)
EGFR: 90 mL/min/{1.73_m2} — ABNORMAL LOW (ref >90)
Glucose: 101 mg/dl (ref 70–140)
Potassium: 3 mEq/L — CL (ref 3.5–5.1)
Sodium: 134 mEq/L — ABNORMAL LOW (ref 136–145)
TOTAL PROTEIN: 6.3 g/dL — AB (ref 6.4–8.3)

## 2015-03-28 LAB — CBC WITH DIFFERENTIAL/PLATELET
BASO%: 0.6 % (ref 0.0–2.0)
Basophils Absolute: 0.1 10*3/uL (ref 0.0–0.1)
EOS%: 0.6 % (ref 0.0–7.0)
Eosinophils Absolute: 0.1 10*3/uL (ref 0.0–0.5)
HCT: 24.7 % — ABNORMAL LOW (ref 38.4–49.9)
HEMOGLOBIN: 8.3 g/dL — AB (ref 13.0–17.1)
LYMPH%: 5 % — ABNORMAL LOW (ref 14.0–49.0)
MCH: 30.5 pg (ref 27.2–33.4)
MCHC: 33.4 g/dL (ref 32.0–36.0)
MCV: 91.4 fL (ref 79.3–98.0)
MONO#: 0.7 10*3/uL (ref 0.1–0.9)
MONO%: 4.3 % (ref 0.0–14.0)
NEUT%: 89.5 % — ABNORMAL HIGH (ref 39.0–75.0)
NEUTROS ABS: 13.9 10*3/uL — AB (ref 1.5–6.5)
PLATELETS: 339 10*3/uL (ref 140–400)
RBC: 2.7 10*6/uL — ABNORMAL LOW (ref 4.20–5.82)
RDW: 20.2 % — ABNORMAL HIGH (ref 11.0–14.6)
WBC: 15.5 10*3/uL — AB (ref 4.0–10.3)
lymph#: 0.8 10*3/uL — ABNORMAL LOW (ref 0.9–3.3)

## 2015-03-28 MED ORDER — POTASSIUM CHLORIDE CRYS ER 20 MEQ PO TBCR: 20.0000 meq | EXTENDED_RELEASE_TABLET | Freq: Every day | ORAL | Status: AC

## 2015-03-28 NOTE — Telephone Encounter (Signed)
Christy notified that pt needs to pick up kdur.

## 2015-04-04 ENCOUNTER — Other Ambulatory Visit (HOSPITAL_BASED_OUTPATIENT_CLINIC_OR_DEPARTMENT_OTHER): Payer: Medicare Other

## 2015-04-04 DIAGNOSIS — C801 Malignant (primary) neoplasm, unspecified: Secondary | ICD-10-CM

## 2015-04-04 DIAGNOSIS — C7A8 Other malignant neuroendocrine tumors: Secondary | ICD-10-CM

## 2015-04-04 DIAGNOSIS — C7B8 Other secondary neuroendocrine tumors: Secondary | ICD-10-CM

## 2015-04-04 LAB — CBC WITH DIFFERENTIAL/PLATELET
BASO%: 0.4 % (ref 0.0–2.0)
Basophils Absolute: 0.1 10*3/uL (ref 0.0–0.1)
EOS%: 0.6 % (ref 0.0–7.0)
Eosinophils Absolute: 0.1 10*3/uL (ref 0.0–0.5)
HEMATOCRIT: 29.7 % — AB (ref 38.4–49.9)
HGB: 9.9 g/dL — ABNORMAL LOW (ref 13.0–17.1)
LYMPH#: 2.2 10*3/uL (ref 0.9–3.3)
LYMPH%: 10.2 % — AB (ref 14.0–49.0)
MCH: 30.8 pg (ref 27.2–33.4)
MCHC: 33.3 g/dL (ref 32.0–36.0)
MCV: 92.5 fL (ref 79.3–98.0)
MONO#: 1.9 10*3/uL — ABNORMAL HIGH (ref 0.1–0.9)
MONO%: 8.7 % (ref 0.0–14.0)
NEUT#: 17.2 10*3/uL — ABNORMAL HIGH (ref 1.5–6.5)
NEUT%: 80.1 % — AB (ref 39.0–75.0)
Platelets: 269 10*3/uL (ref 140–400)
RBC: 3.21 10*6/uL — AB (ref 4.20–5.82)
RDW: 19 % — ABNORMAL HIGH (ref 11.0–14.6)
WBC: 21.4 10*3/uL — ABNORMAL HIGH (ref 4.0–10.3)

## 2015-04-04 LAB — COMPREHENSIVE METABOLIC PANEL (CC13)
ALT: 11 U/L (ref 0–55)
AST: 16 U/L (ref 5–34)
Albumin: 3 g/dL — ABNORMAL LOW (ref 3.5–5.0)
Alkaline Phosphatase: 191 U/L — ABNORMAL HIGH (ref 40–150)
Anion Gap: 9 mEq/L (ref 3–11)
BUN: 16.2 mg/dL (ref 7.0–26.0)
CALCIUM: 9.1 mg/dL (ref 8.4–10.4)
CHLORIDE: 96 meq/L — AB (ref 98–109)
CO2: 29 mEq/L (ref 22–29)
Creatinine: 1.1 mg/dL (ref 0.7–1.3)
EGFR: 68 mL/min/{1.73_m2} — ABNORMAL LOW (ref >90)
Glucose: 117 mg/dl (ref 70–140)
POTASSIUM: 4 meq/L (ref 3.5–5.1)
Sodium: 134 mEq/L — ABNORMAL LOW (ref 136–145)
Total Bilirubin: 0.31 mg/dL (ref 0.20–1.20)
Total Protein: 6.7 g/dL (ref 6.4–8.3)

## 2015-04-06 ENCOUNTER — Telehealth: Payer: Self-pay | Admitting: Medical Oncology

## 2015-04-06 NOTE — Telephone Encounter (Signed)
Peggy from scheduling called and said pt daughter asking for appt to be sent to [facility] green. AVS faxed to [facility]

## 2015-04-10 ENCOUNTER — Ambulatory Visit (HOSPITAL_COMMUNITY)
Admission: RE | Admit: 2015-04-10 | Discharge: 2015-04-10 | Disposition: A | Discharge status: Home / Self Care | Payer: Medicare Other | Source: Ambulatory Visit | Attending: Internal Medicine | Admitting: Internal Medicine

## 2015-04-10 ENCOUNTER — Encounter (HOSPITAL_COMMUNITY): Payer: Self-pay

## 2015-04-10 DIAGNOSIS — C3490 Malignant neoplasm of unspecified part of unspecified bronchus or lung: Secondary | ICD-10-CM | POA: Diagnosis present

## 2015-04-10 MED ORDER — IOHEXOL 300 MG/ML  SOLN
100.0000 mL | Freq: Once | INTRAMUSCULAR | Status: AC | PRN
  Administered 2015-04-10: 100 mL via INTRAVENOUS

## 2015-04-11 ENCOUNTER — Other Ambulatory Visit (HOSPITAL_BASED_OUTPATIENT_CLINIC_OR_DEPARTMENT_OTHER): Payer: Medicare Other

## 2015-04-11 ENCOUNTER — Ambulatory Visit (HOSPITAL_BASED_OUTPATIENT_CLINIC_OR_DEPARTMENT_OTHER): Payer: Medicare Other

## 2015-04-11 ENCOUNTER — Other Ambulatory Visit: Payer: Self-pay | Admitting: Internal Medicine

## 2015-04-11 VITALS — BP 147/85 | HR 100 | Temp 98.0°F | Resp 20

## 2015-04-11 DIAGNOSIS — C801 Malignant (primary) neoplasm, unspecified: Secondary | ICD-10-CM

## 2015-04-11 DIAGNOSIS — C7A8 Other malignant neuroendocrine tumors: Secondary | ICD-10-CM

## 2015-04-11 DIAGNOSIS — Z5111 Encounter for antineoplastic chemotherapy: Secondary | ICD-10-CM | POA: Diagnosis present

## 2015-04-11 DIAGNOSIS — C7B8 Other secondary neuroendocrine tumors: Secondary | ICD-10-CM | POA: Diagnosis not present

## 2015-04-11 DIAGNOSIS — C3491 Malignant neoplasm of unspecified part of right bronchus or lung: Secondary | ICD-10-CM

## 2015-04-11 LAB — CBC WITH DIFFERENTIAL/PLATELET
BASO%: 0.7 % (ref 0.0–2.0)
Basophils Absolute: 0.1 10*3/uL (ref 0.0–0.1)
EOS ABS: 0.1 10*3/uL (ref 0.0–0.5)
EOS%: 0.4 % (ref 0.0–7.0)
HCT: 32.9 % — ABNORMAL LOW (ref 38.4–49.9)
HGB: 11 g/dL — ABNORMAL LOW (ref 13.0–17.1)
LYMPH%: 9.3 % — AB (ref 14.0–49.0)
MCH: 30.2 pg (ref 27.2–33.4)
MCHC: 33.5 g/dL (ref 32.0–36.0)
MCV: 90.2 fL (ref 79.3–98.0)
MONO#: 1.3 10*3/uL — ABNORMAL HIGH (ref 0.1–0.9)
MONO%: 8.8 % (ref 0.0–14.0)
NEUT%: 80.8 % — AB (ref 39.0–75.0)
NEUTROS ABS: 12.3 10*3/uL — AB (ref 1.5–6.5)
PLATELETS: 720 10*3/uL — AB (ref 140–400)
RBC: 3.65 10*6/uL — AB (ref 4.20–5.82)
RDW: 19.3 % — ABNORMAL HIGH (ref 11.0–14.6)
WBC: 15.2 10*3/uL — AB (ref 4.0–10.3)
lymph#: 1.4 10*3/uL (ref 0.9–3.3)

## 2015-04-11 LAB — COMPREHENSIVE METABOLIC PANEL (CC13)
ALBUMIN: 3.3 g/dL — AB (ref 3.5–5.0)
ALK PHOS: 169 U/L — AB (ref 40–150)
ALT: 11 U/L (ref 0–55)
AST: 19 U/L (ref 5–34)
Anion Gap: 11 mEq/L (ref 3–11)
BUN: 14.3 mg/dL (ref 7.0–26.0)
CALCIUM: 9.5 mg/dL (ref 8.4–10.4)
CO2: 31 mEq/L — ABNORMAL HIGH (ref 22–29)
Chloride: 93 mEq/L — ABNORMAL LOW (ref 98–109)
Creatinine: 1 mg/dL (ref 0.7–1.3)
EGFR: 81 mL/min/{1.73_m2} — AB (ref >90)
Glucose: 78 mg/dl (ref 70–140)
POTASSIUM: 3.5 meq/L (ref 3.5–5.1)
Sodium: 135 mEq/L — ABNORMAL LOW (ref 136–145)
Total Bilirubin: 0.21 mg/dL (ref 0.20–1.20)
Total Protein: 7.3 g/dL (ref 6.4–8.3)

## 2015-04-11 MED ORDER — SODIUM CHLORIDE 0.9 % IV SOLN
Freq: Once | INTRAVENOUS | Status: AC
  Administered 2015-04-11: 13:00:00 via INTRAVENOUS
  Filled 2015-04-11: qty 8

## 2015-04-11 MED ORDER — SODIUM CHLORIDE 0.9 % IV SOLN
100.0000 mg/m2 | Freq: Once | INTRAVENOUS | Status: AC
  Administered 2015-04-11: 160 mg via INTRAVENOUS
  Filled 2015-04-11: qty 8

## 2015-04-11 MED ORDER — CARBOPLATIN CHEMO INJECTION 450 MG/45ML
337.5000 mg | Freq: Once | INTRAVENOUS | Status: AC
  Administered 2015-04-11: 340 mg via INTRAVENOUS
  Filled 2015-04-11: qty 34

## 2015-04-11 MED ORDER — SODIUM CHLORIDE 0.9 % IV SOLN
Freq: Once | INTRAVENOUS | Status: AC
  Administered 2015-04-11: 12:00:00 via INTRAVENOUS

## 2015-04-11 NOTE — Patient Instructions (Signed)
[facility] Discharge Instructions for Patients Receiving Chemotherapy  Today you received the following chemotherapy agents Carboplatin/VP 16 (Etoposide) To help prevent nausea and vomiting after your treatment, we encourage you to take your nausea medication as prescribed.  If you develop nausea and vomiting that is not controlled by your nausea medication, call the clinic.   BELOW ARE SYMPTOMS THAT SHOULD BE REPORTED IMMEDIATELY:  *FEVER GREATER THAN 100.5 F  *CHILLS WITH OR WITHOUT FEVER  NAUSEA AND VOMITING THAT IS NOT CONTROLLED WITH YOUR NAUSEA MEDICATION  *UNUSUAL SHORTNESS OF BREATH  *UNUSUAL BRUISING OR BLEEDING  TENDERNESS IN MOUTH AND THROAT WITH OR WITHOUT PRESENCE OF ULCERS  *URINARY PROBLEMS  *BOWEL PROBLEMS  UNUSUAL RASH Items with * indicate a potential emergency and should be followed up as soon as possible.  Feel free to call the clinic you have any questions or concerns. The clinic phone number is [phone].  Please show the [facility] at check-in to the Emergency Department and triage nurse.

## 2015-04-12 ENCOUNTER — Ambulatory Visit (HOSPITAL_BASED_OUTPATIENT_CLINIC_OR_DEPARTMENT_OTHER): Payer: Medicare Other

## 2015-04-12 VITALS — BP 127/81 | HR 103 | Temp 98.4°F | Resp 16

## 2015-04-12 DIAGNOSIS — C7B8 Other secondary neuroendocrine tumors: Secondary | ICD-10-CM | POA: Diagnosis not present

## 2015-04-12 DIAGNOSIS — Z5111 Encounter for antineoplastic chemotherapy: Secondary | ICD-10-CM

## 2015-04-12 DIAGNOSIS — C801 Malignant (primary) neoplasm, unspecified: Secondary | ICD-10-CM

## 2015-04-12 DIAGNOSIS — C7A8 Other malignant neuroendocrine tumors: Secondary | ICD-10-CM | POA: Diagnosis not present

## 2015-04-12 DIAGNOSIS — C3491 Malignant neoplasm of unspecified part of right bronchus or lung: Secondary | ICD-10-CM

## 2015-04-12 MED ORDER — PROCHLORPERAZINE MALEATE 10 MG PO TABS
ORAL_TABLET | ORAL | Status: AC
  Filled 2015-04-12: qty 1

## 2015-04-12 MED ORDER — PROCHLORPERAZINE MALEATE 10 MG PO TABS
10.0000 mg | ORAL_TABLET | Freq: Once | ORAL | Status: AC
  Administered 2015-04-12: 10 mg via ORAL

## 2015-04-12 MED ORDER — SODIUM CHLORIDE 0.9 % IV SOLN
Freq: Once | INTRAVENOUS | Status: AC
  Administered 2015-04-12: 15:00:00 via INTRAVENOUS

## 2015-04-12 MED ORDER — SODIUM CHLORIDE 0.9 % IV SOLN
100.0000 mg/m2 | Freq: Once | INTRAVENOUS | Status: AC
  Administered 2015-04-12: 160 mg via INTRAVENOUS
  Filled 2015-04-12: qty 8

## 2015-04-12 NOTE — Patient Instructions (Signed)
[facility] Discharge Instructions for Patients Receiving Chemotherapy  Today you received the following chemotherapy agents:  etoposide  To help prevent nausea and vomiting after your treatment, we encourage you to take your nausea medication.   If you develop nausea and vomiting that is not controlled by your nausea medication, call the clinic.   BELOW ARE SYMPTOMS THAT SHOULD BE REPORTED IMMEDIATELY:  *FEVER GREATER THAN 100.5 F  *CHILLS WITH OR WITHOUT FEVER  NAUSEA AND VOMITING THAT IS NOT CONTROLLED WITH YOUR NAUSEA MEDICATION  *UNUSUAL SHORTNESS OF BREATH  *UNUSUAL BRUISING OR BLEEDING  TENDERNESS IN MOUTH AND THROAT WITH OR WITHOUT PRESENCE OF ULCERS  *URINARY PROBLEMS  *BOWEL PROBLEMS  UNUSUAL RASH Items with * indicate a potential emergency and should be followed up as soon as possible.  Feel free to call the clinic you have any questions or concerns. The clinic phone number is [phone].  Please show the [place] at check-in to the Emergency Department and triage nurse.

## 2015-04-13 ENCOUNTER — Ambulatory Visit (HOSPITAL_BASED_OUTPATIENT_CLINIC_OR_DEPARTMENT_OTHER): Payer: Medicare Other

## 2015-04-13 ENCOUNTER — Ambulatory Visit: Payer: Medicare Other | Admitting: Nutrition

## 2015-04-13 VITALS — BP 140/77 | HR 100 | Temp 97.8°F | Resp 20

## 2015-04-13 DIAGNOSIS — Z5111 Encounter for antineoplastic chemotherapy: Secondary | ICD-10-CM

## 2015-04-13 DIAGNOSIS — Z5189 Encounter for other specified aftercare: Secondary | ICD-10-CM

## 2015-04-13 DIAGNOSIS — C7B8 Other secondary neuroendocrine tumors: Secondary | ICD-10-CM

## 2015-04-13 DIAGNOSIS — C801 Malignant (primary) neoplasm, unspecified: Secondary | ICD-10-CM

## 2015-04-13 DIAGNOSIS — C7A8 Other malignant neuroendocrine tumors: Secondary | ICD-10-CM

## 2015-04-13 DIAGNOSIS — C3491 Malignant neoplasm of unspecified part of right bronchus or lung: Secondary | ICD-10-CM

## 2015-04-13 MED ORDER — PEGFILGRASTIM 6 MG/0.6ML ~~LOC~~ PSKT
6.0000 mg | PREFILLED_SYRINGE | Freq: Once | SUBCUTANEOUS | Status: AC
  Administered 2015-04-13: 6 mg via SUBCUTANEOUS
  Filled 2015-04-13: qty 0.6

## 2015-04-13 MED ORDER — SODIUM CHLORIDE 0.9 % IV SOLN
Freq: Once | INTRAVENOUS | Status: AC
  Administered 2015-04-13: 13:00:00 via INTRAVENOUS

## 2015-04-13 MED ORDER — PROCHLORPERAZINE MALEATE 10 MG PO TABS
10.0000 mg | ORAL_TABLET | Freq: Once | ORAL | Status: AC
  Administered 2015-04-13: 10 mg via ORAL

## 2015-04-13 MED ORDER — SODIUM CHLORIDE 0.9 % IV SOLN
100.0000 mg/m2 | Freq: Once | INTRAVENOUS | Status: AC
  Administered 2015-04-13: 160 mg via INTRAVENOUS
  Filled 2015-04-13: qty 8

## 2015-04-13 MED ORDER — PROCHLORPERAZINE MALEATE 10 MG PO TABS
ORAL_TABLET | ORAL | Status: AC
  Filled 2015-04-13: qty 1

## 2015-04-13 NOTE — Patient Instructions (Signed)
[facility] Discharge Instructions for Patients Receiving Chemotherapy  Today you received the following chemotherapy agents:  etoposide  To help prevent nausea and vomiting after your treatment, we encourage you to take your nausea medication.   If you develop nausea and vomiting that is not controlled by your nausea medication, call the clinic.   BELOW ARE SYMPTOMS THAT SHOULD BE REPORTED IMMEDIATELY:  *FEVER GREATER THAN 100.5 F  *CHILLS WITH OR WITHOUT FEVER  NAUSEA AND VOMITING THAT IS NOT CONTROLLED WITH YOUR NAUSEA MEDICATION  *UNUSUAL SHORTNESS OF BREATH  *UNUSUAL BRUISING OR BLEEDING  TENDERNESS IN MOUTH AND THROAT WITH OR WITHOUT PRESENCE OF ULCERS  *URINARY PROBLEMS  *BOWEL PROBLEMS  UNUSUAL RASH Items with * indicate a potential emergency and should be followed up as soon as possible.  Feel free to call the clinic you have any questions or concerns. The clinic phone number is [phone].  Please show the [place] at check-in to the Emergency Department and triage nurse.

## 2015-04-13 NOTE — Progress Notes (Signed)
Nutrition follow-up completed with patient during treatment for lung cancer. Patient continues living in an assisted-living home. Weight stable 116.5# last week per pt, pt reports wt have ranged b/t 115-120#  BMI of 17.13 indicates patient remains underweight. Pt reports, he is trying to eat more small frequent meals.  He denies issues with nausea, constipation or diarrhea. Patient consumes 1-2 supplements/day   Nutrition diagnosis: Malnutrition, improved.  Intervention:  Patient encouraged to increase calories and protein at mealtimes.  Recommended patient increase oral nutrition supplements twice a day Teach back method used.  Monitoring, evaluation, goals:  Patient will work to increase calories and protein to promote continued weight gain.  Next visit: To be scheduled during next chemo.

## 2015-04-17 ENCOUNTER — Encounter: Payer: Self-pay | Admitting: Internal Medicine

## 2015-04-18 ENCOUNTER — Other Ambulatory Visit (HOSPITAL_BASED_OUTPATIENT_CLINIC_OR_DEPARTMENT_OTHER): Payer: Medicare Other

## 2015-04-18 DIAGNOSIS — C801 Malignant (primary) neoplasm, unspecified: Secondary | ICD-10-CM

## 2015-04-18 LAB — CBC WITH DIFFERENTIAL/PLATELET
BASO%: 0.4 % (ref 0.0–2.0)
Basophils Absolute: 0.1 10*3/uL (ref 0.0–0.1)
EOS%: 0.8 % (ref 0.0–7.0)
Eosinophils Absolute: 0.2 10*3/uL (ref 0.0–0.5)
HEMATOCRIT: 27.2 % — AB (ref 38.4–49.9)
HEMOGLOBIN: 9 g/dL — AB (ref 13.0–17.1)
LYMPH#: 1.3 10*3/uL (ref 0.9–3.3)
LYMPH%: 5.9 % — ABNORMAL LOW (ref 14.0–49.0)
MCH: 30.2 pg (ref 27.2–33.4)
MCHC: 33.3 g/dL (ref 32.0–36.0)
MCV: 90.7 fL (ref 79.3–98.0)
MONO#: 1.2 10*3/uL — ABNORMAL HIGH (ref 0.1–0.9)
MONO%: 5.3 % (ref 0.0–14.0)
NEUT%: 87.6 % — ABNORMAL HIGH (ref 39.0–75.0)
NEUTROS ABS: 19.3 10*3/uL — AB (ref 1.5–6.5)
Platelets: 343 10*3/uL (ref 140–400)
RBC: 2.99 10*6/uL — ABNORMAL LOW (ref 4.20–5.82)
RDW: 18.2 % — AB (ref 11.0–14.6)
WBC: 22 10*3/uL — AB (ref 4.0–10.3)

## 2015-04-18 LAB — COMPREHENSIVE METABOLIC PANEL (CC13)
ALBUMIN: 3 g/dL — AB (ref 3.5–5.0)
ALK PHOS: 204 U/L — AB (ref 40–150)
ALT: 12 U/L (ref 0–55)
AST: 16 U/L (ref 5–34)
Anion Gap: 8 mEq/L (ref 3–11)
BUN: 13.1 mg/dL (ref 7.0–26.0)
CALCIUM: 8.6 mg/dL (ref 8.4–10.4)
CHLORIDE: 97 meq/L — AB (ref 98–109)
CO2: 29 mEq/L (ref 22–29)
CREATININE: 1 mg/dL (ref 0.7–1.3)
EGFR: 79 mL/min/{1.73_m2} — ABNORMAL LOW (ref >90)
GLUCOSE: 95 mg/dL (ref 70–140)
Potassium: 3.2 mEq/L — ABNORMAL LOW (ref 3.5–5.1)
SODIUM: 135 meq/L — AB (ref 136–145)
Total Bilirubin: <0.3 mg/dL (ref 0.20–1.20)
Total Protein: 6.4 g/dL (ref 6.4–8.3)

## 2015-04-19 ENCOUNTER — Encounter: Payer: Self-pay | Admitting: Medical Oncology

## 2015-04-19 NOTE — Progress Notes (Signed)
Letter mailed to pt for [employer] request

## 2015-04-22 ENCOUNTER — Inpatient Hospital Stay (HOSPITAL_COMMUNITY)
Admission: EM | Admit: 2015-04-22 | Discharge: 2015-04-28 | DRG: 180 | Disposition: E | Payer: Medicare Other | Attending: Internal Medicine | Admitting: Internal Medicine

## 2015-04-22 ENCOUNTER — Emergency Department (HOSPITAL_COMMUNITY): Payer: Medicare Other

## 2015-04-22 ENCOUNTER — Encounter (HOSPITAL_COMMUNITY): Payer: Self-pay | Admitting: Emergency Medicine

## 2015-04-22 DIAGNOSIS — I69351 Hemiplegia and hemiparesis following cerebral infarction affecting right dominant side: Secondary | ICD-10-CM

## 2015-04-22 DIAGNOSIS — R0902 Hypoxemia: Secondary | ICD-10-CM

## 2015-04-22 DIAGNOSIS — J9600 Acute respiratory failure, unspecified whether with hypoxia or hypercapnia: Secondary | ICD-10-CM | POA: Diagnosis present

## 2015-04-22 DIAGNOSIS — R0602 Shortness of breath: Secondary | ICD-10-CM | POA: Diagnosis present

## 2015-04-22 DIAGNOSIS — Z825 Family history of asthma and other chronic lower respiratory diseases: Secondary | ICD-10-CM | POA: Diagnosis not present

## 2015-04-22 DIAGNOSIS — Z7982 Long term (current) use of aspirin: Secondary | ICD-10-CM | POA: Diagnosis not present

## 2015-04-22 DIAGNOSIS — J690 Pneumonitis due to inhalation of food and vomit: Secondary | ICD-10-CM | POA: Diagnosis present

## 2015-04-22 DIAGNOSIS — I10 Essential (primary) hypertension: Secondary | ICD-10-CM | POA: Diagnosis present

## 2015-04-22 DIAGNOSIS — F419 Anxiety disorder, unspecified: Secondary | ICD-10-CM | POA: Diagnosis present

## 2015-04-22 DIAGNOSIS — R413 Other amnesia: Secondary | ICD-10-CM | POA: Diagnosis present

## 2015-04-22 DIAGNOSIS — C787 Secondary malignant neoplasm of liver and intrahepatic bile duct: Secondary | ICD-10-CM | POA: Diagnosis present

## 2015-04-22 DIAGNOSIS — Z79899 Other long term (current) drug therapy: Secondary | ICD-10-CM

## 2015-04-22 DIAGNOSIS — C3490 Malignant neoplasm of unspecified part of unspecified bronchus or lung: Secondary | ICD-10-CM | POA: Diagnosis present

## 2015-04-22 DIAGNOSIS — Z79891 Long term (current) use of opiate analgesic: Secondary | ICD-10-CM

## 2015-04-22 DIAGNOSIS — Z820 Family history of epilepsy and other diseases of the nervous system: Secondary | ICD-10-CM | POA: Diagnosis not present

## 2015-04-22 DIAGNOSIS — Z66 Do not resuscitate: Secondary | ICD-10-CM | POA: Diagnosis present

## 2015-04-22 DIAGNOSIS — Z87891 Personal history of nicotine dependence: Secondary | ICD-10-CM | POA: Diagnosis not present

## 2015-04-22 DIAGNOSIS — Z7902 Long term (current) use of antithrombotics/antiplatelets: Secondary | ICD-10-CM | POA: Diagnosis not present

## 2015-04-22 DIAGNOSIS — Z823 Family history of stroke: Secondary | ICD-10-CM

## 2015-04-22 DIAGNOSIS — J9601 Acute respiratory failure with hypoxia: Secondary | ICD-10-CM

## 2015-04-22 DIAGNOSIS — Z515 Encounter for palliative care: Secondary | ICD-10-CM

## 2015-04-22 DIAGNOSIS — Z8249 Family history of ischemic heart disease and other diseases of the circulatory system: Secondary | ICD-10-CM | POA: Diagnosis not present

## 2015-04-22 LAB — CBC WITH DIFFERENTIAL/PLATELET
BASOS ABS: 0.1 10*3/uL (ref 0.0–0.1)
Basophils Relative: 0 %
EOS ABS: 0 10*3/uL (ref 0.0–0.7)
EOS PCT: 0 %
HCT: 27.9 % — ABNORMAL LOW (ref 39.0–52.0)
Hemoglobin: 9 g/dL — ABNORMAL LOW (ref 13.0–17.0)
Lymphocytes Relative: 8 %
Lymphs Abs: 1.5 10*3/uL (ref 0.7–4.0)
MCH: 30 pg (ref 26.0–34.0)
MCHC: 32.3 g/dL (ref 30.0–36.0)
MCV: 93 fL (ref 78.0–100.0)
Monocytes Absolute: 2.4 10*3/uL — ABNORMAL HIGH (ref 0.1–1.0)
Monocytes Relative: 12 %
Neutro Abs: 16.1 10*3/uL — ABNORMAL HIGH (ref 1.7–7.7)
Neutrophils Relative %: 80 %
PLATELETS: 244 10*3/uL (ref 150–400)
RBC: 3 MIL/uL — AB (ref 4.22–5.81)
RDW: 17.8 % — AB (ref 11.5–15.5)
WBC: 20 10*3/uL — AB (ref 4.0–10.5)

## 2015-04-22 LAB — URINALYSIS, ROUTINE W REFLEX MICROSCOPIC
BILIRUBIN URINE: NEGATIVE
GLUCOSE, UA: NEGATIVE mg/dL
Hgb urine dipstick: NEGATIVE
KETONES UR: NEGATIVE mg/dL
NITRITE: NEGATIVE
Specific Gravity, Urine: 1.021 (ref 1.005–1.030)
Urobilinogen, UA: 1 mg/dL (ref 0.0–1.0)
pH: 5.5 (ref 5.0–8.0)

## 2015-04-22 LAB — URINE MICROSCOPIC-ADD ON

## 2015-04-22 LAB — COMPREHENSIVE METABOLIC PANEL
ALK PHOS: 163 U/L — AB (ref 38–126)
ALT: 14 U/L — AB (ref 17–63)
AST: 23 U/L (ref 15–41)
Albumin: 3.1 g/dL — ABNORMAL LOW (ref 3.5–5.0)
Anion gap: 10 (ref 5–15)
BILIRUBIN TOTAL: 0.5 mg/dL (ref 0.3–1.2)
BUN: 39 mg/dL — AB (ref 6–20)
CALCIUM: 8.8 mg/dL — AB (ref 8.9–10.3)
CO2: 23 mmol/L (ref 22–32)
CREATININE: 1.51 mg/dL — AB (ref 0.61–1.24)
Chloride: 99 mmol/L — ABNORMAL LOW (ref 101–111)
GFR calc Af Amer: 52 mL/min — ABNORMAL LOW (ref >60)
GFR, EST NON AFRICAN AMERICAN: 45 mL/min — AB (ref >60)
Glucose, Bld: 146 mg/dL — ABNORMAL HIGH (ref 65–99)
Potassium: 5.2 mmol/L — ABNORMAL HIGH (ref 3.5–5.1)
Sodium: 132 mmol/L — ABNORMAL LOW (ref 135–145)
TOTAL PROTEIN: 6.8 g/dL (ref 6.5–8.1)

## 2015-04-22 LAB — BRAIN NATRIURETIC PEPTIDE: B NATRIURETIC PEPTIDE 5: 1805.8 pg/mL — AB (ref 0.0–100.0)

## 2015-04-22 LAB — I-STAT CG4 LACTIC ACID, ED
LACTIC ACID, VENOUS: 2.62 mmol/L — AB (ref 0.5–2.0)
LACTIC ACID, VENOUS: 3.13 mmol/L — AB (ref 0.5–2.0)

## 2015-04-22 MED ORDER — LORAZEPAM 2 MG/ML IJ SOLN
1.0000 mg | INTRAMUSCULAR | Status: DC | PRN
  Administered 2015-04-22: 1 mg via INTRAVENOUS

## 2015-04-22 MED ORDER — LIDOCAINE HCL (CARDIAC) 20 MG/ML IV SOLN
INTRAVENOUS | Status: AC
  Filled 2015-04-22: qty 5

## 2015-04-22 MED ORDER — SODIUM CHLORIDE 0.9 % IV BOLUS (SEPSIS)
1000.0000 mL | INTRAVENOUS | Status: AC
  Administered 2015-04-22 (×2): 1000 mL via INTRAVENOUS

## 2015-04-22 MED ORDER — IOHEXOL 350 MG/ML SOLN
100.0000 mL | Freq: Once | INTRAVENOUS | Status: AC | PRN
  Administered 2015-04-22: 80 mL via INTRAVENOUS

## 2015-04-22 MED ORDER — MORPHINE BOLUS VIA INFUSION
1.0000 mg | INTRAVENOUS | Status: DC | PRN
  Filled 2015-04-22: qty 4

## 2015-04-22 MED ORDER — SUCCINYLCHOLINE CHLORIDE 20 MG/ML IJ SOLN
INTRAMUSCULAR | Status: AC
  Filled 2015-04-22: qty 1

## 2015-04-22 MED ORDER — ROCURONIUM BROMIDE 50 MG/5ML IV SOLN
INTRAVENOUS | Status: AC
  Filled 2015-04-22: qty 2

## 2015-04-22 MED ORDER — MORPHINE SULFATE 25 MG/ML IV SOLN
1.0000 mg/h | INTRAVENOUS | Status: DC
  Filled 2015-04-22: qty 10

## 2015-04-22 MED ORDER — MORPHINE SULFATE (PF) 4 MG/ML IV SOLN
4.0000 mg | Freq: Once | INTRAVENOUS | Status: AC
  Administered 2015-04-22: 4 mg via INTRAVENOUS
  Filled 2015-04-22: qty 1

## 2015-04-22 MED ORDER — METHOCARBAMOL 1000 MG/10ML IJ SOLN
500.0000 mg | Freq: Once | INTRAMUSCULAR | Status: AC
  Administered 2015-04-22: 500 mg via INTRAMUSCULAR
  Filled 2015-04-22: qty 5

## 2015-04-22 MED ORDER — LORAZEPAM 2 MG/ML IJ SOLN: 1.0000 mg | INTRAMUSCULAR | Status: DC | PRN

## 2015-04-22 MED ORDER — ETOMIDATE 2 MG/ML IV SOLN
INTRAVENOUS | Status: AC
  Filled 2015-04-22: qty 20

## 2015-04-22 MED ORDER — MORPHINE SULFATE (PF) 4 MG/ML IV SOLN: 4.0000 mg | Freq: Once | INTRAVENOUS | Status: DC

## 2015-04-22 MED ORDER — LORAZEPAM 2 MG/ML IJ SOLN
INTRAMUSCULAR | Status: AC
  Filled 2015-04-22: qty 1

## 2015-04-23 LAB — BLOOD GAS, ARTERIAL
Acid-base deficit: 2.7 mmol/L — ABNORMAL HIGH (ref 0.0–2.0)
BICARBONATE: 20.3 meq/L (ref 20.0–24.0)
FIO2: 1
O2 Saturation: 98.2 %
PATIENT TEMPERATURE: 98.6
PH ART: 7.446 (ref 7.350–7.450)
PO2 ART: 113 mmHg — AB (ref 80.0–100.0)
TCO2: 19 mmol/L (ref 0–100)
pCO2 arterial: 30 mmHg — ABNORMAL LOW (ref 35.0–45.0)

## 2015-04-25 ENCOUNTER — Other Ambulatory Visit: Payer: Medicare Other

## 2015-04-27 LAB — URINE CULTURE: Culture: >=100000

## 2015-04-28 ENCOUNTER — Telehealth (HOSPITAL_BASED_OUTPATIENT_CLINIC_OR_DEPARTMENT_OTHER): Payer: Self-pay | Admitting: Emergency Medicine

## 2015-04-28 LAB — CULTURE, BLOOD (ROUTINE X 2)
Culture: NO GROWTH
Culture: NO GROWTH

## 2015-04-28 NOTE — ED Notes (Signed)
Family at bedside. 

## 2015-04-28 NOTE — ED Notes (Signed)
Per MD d/c IV fluid at 1L.

## 2015-04-28 NOTE — ED Notes (Signed)
Patient 76% on RA; placed on [place] '@2L'$  78%.

## 2015-04-28 NOTE — Progress Notes (Signed)
Per MD's request, placed pt on cpap 5cm h2o for increased wob.  7L O2 bledin.  MB559, rr23, spo2 93%.  BP 127/733.  RT will continue to monitor pt.
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
## 2015-04-28 NOTE — ED Notes (Signed)
[facility] notified of new referral.  Per Daine Gip of [facility] pt is not a candidate for eyes or tissue.

## 2015-04-28 NOTE — Telephone Encounter (Signed)
+   urine culture respor tof E. Coli Dated 04-May-2015, pt is deceased
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
## 2015-04-28 NOTE — Consult Note (Signed)
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  Presented 9/25 with increased SOB.  Treated in ER but deteriorated very quickly.  PCCM called for ICU admission.   SIGNIFICANT EVENTS    STUDIES:  CTA chest 9/25>>> 1. Motion limited exam, no central pulmonary embolus. 2. Development of moderate bilateral pleural effusions. Diffuse perihilar ground-glass opacities. Suspect pulmonary edema and CHF. 3. Dependent debris in the trachea, bronchus intermedius and right upper lobe bronchus with mucous plugging in the right lower lobe. Correlation recommended for aspiration. 4. The previously described nodular opacities in the right lower lobe are obscured by atelectasis in motion.    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  Presented 9/25 with increased SOB.  Treated in ER but deteriorated very quickly.  PCCM called for ICU admission.   PAST MEDICAL HISTORY :   has a past medical history of Hypertension; Memory loss; Stroke (2011); Shortness of breath dyspnea; Anxiety; Neuromuscular disorder; and Cancer.  has past surgical history that includes Hand surgery. Prior to Admission medications   Medication Sig Start Date End Date Taking? Authorizing Provider  Amino Acids-Protein Hydrolys (FEEDING SUPPLEMENT, PRO-STAT SUGAR FREE 64,) LIQD Take 30 mLs by mouth 2 (two) times daily. 12/21/14  Yes Debbe Odea, MD  amLODipine (NORVASC) 10 MG tablet Take 10 mg by mouth daily.   Yes Historical Provider, MD  aspirin EC 81 MG EC tablet  Take 1 tablet (81 mg total) by mouth daily. 12/21/14  Yes Debbe Odea, MD  baclofen (LIORESAL) 10 MG tablet Take 1 tablet (10 mg total) by mouth 4 (four) times daily. 09/27/14  Yes Pieter Partridge, DO  Cholecalciferol (VITAMIN D3 PO) Take 1 tablet by mouth daily. 1000 units   Yes Historical Provider, MD  cloNIDine (CATAPRES) 0.1 MG tablet Take 0.1 mg by mouth every 6 (six) hours as needed. For SBP > 180   Yes Historical Provider, MD  clopidogrel (PLAVIX) 75 MG tablet Take 1 tablet (75 mg total) by mouth daily. 01/03/15  Yes Velvet Bathe, MD  cyclobenzaprine (FLEXERIL) 10 MG tablet Take 10 mg by mouth 3 (three) times daily.   Yes Historical Provider, MD  FOLIC ACID PO Take 1 tablet by mouth daily. 400 mcg   Yes Historical Provider, MD  HYDROcodone-acetaminophen (NORCO/VICODIN) 5-325 MG per tablet Take 1 tablet by mouth 3 (three) times daily as needed for moderate pain.   Yes Historical Provider, MD  MELATONIN PO Take 1 tablet by mouth at bedtime. 3 mg   Yes Historical Provider, MD  Multiple Vitamin (MULTIVITAMIN WITH MINERALS) TABS tablet Take 1 tablet by mouth daily. 12/21/14  Yes Debbe Odea, MD  potassium chloride SA (K-DUR,KLOR-CON) 20 MEQ tablet Take 1 tablet (20 mEq total) by mouth daily. Patient taking differently: Take 20 mEq by mouth 2 (two) times daily.  03/28/15  Yes Curt Bears, MD  prochlorperazine (COMPAZINE) 10 MG tablet Take 1 tablet (10 mg total) by mouth every 6 (six) hours as needed for nausea or vomiting. 01/13/15  Yes Curt Bears, MD  Thiamine HCl (VITAMIN B-1  PO) Take 1 tablet by mouth daily. 500 mg   Yes Historical Provider, MD  pravastatin (PRAVACHOL) 80 MG tablet TAKE 1 TABLET BY MOUTH ONCE DAILY 02/20/15   Gold[place] Circle, FNP   No Known Allergies  FAMILY HISTORY:  family history includes Alzheimer's disease (age of onset: 108) in his mother; COPD in his sister; Heart failure (age of onset: 31) in his father; Immunocompromised in his daughter and sister; Migraines in his  daughter; Stroke in his maternal grandmother. SOCIAL HISTORY:  reports that he quit smoking about 4 years ago. He has never used smokeless tobacco. He reports that he drinks about 16.8 oz of alcohol per week. He reports that he does not use illicit drugs.  REVIEW OF SYSTEMS:   unable  SUBJECTIVE:   VITAL SIGNS: Temp:  [96.9 F (36.1 C)-97 F (36.1 C)] 97 F (36.1 C) (09/25 2003) Pulse Rate:  [103-113] 108 (09/25 2015) Resp:  [23-26] 24 (09/25 2115) BP: (114-128)/(73-99) 128/99 mmHg (09/25 2115) SpO2:  [85 %-100 %] 100 % (09/25 2015) Weight:  [116 lb (52.617 kg)] 116 lb (52.617 kg) (09/25 1650)  PHYSICAL EXAMINATION: General:  Frail, cachectic male, near agonal, agitated intermittently Neuro:  Lethargic  HEENT:  Mm dry, no JVD  Cardiovascular:  s1s2 brady Lungs:  resps agonal, coarse  Abdomen:  Soft, +bs  Musculoskeletal:  Cool, dry    Recent Labs Lab 04/18/15 1327 25-Apr-2015 1804  NA 135* 132*  K 3.2* 5.2*  CL  --  99*  CO2 29 23  BUN 13.1 39*  CREATININE 1.0 1.51*  GLUCOSE 95 146*    Recent Labs Lab 04/18/15 1327 April 25, 2015 1804  HGB 9.0* 9.0*  HCT 27.2* 27.9*  WBC 22.0* 20.0*  PLT 343 244   Dg Chest 1 View  25-Apr-2015   CLINICAL DATA:  Patient with shortness of breath. Undergoing radiation for lung cancer.  EXAM: CHEST 1 VIEW  COMPARISON:  Chest CT 04/10/2015  FINDINGS: Stable cardiac and mediastinal contours. Interval development of diffuse bilateral airspace opacities, right-greater-than-left. No definite pleural effusion or pneumothorax.  IMPRESSION: New diffuse bilateral airspace opacities more focal within the right mid lung which may represent infection, aspiration, hemorrhage, edema and/or drug reaction.   Electronically Signed   By: Lovey Newcomer M.D.   On: 25-Apr-2015 18:00   Ct Angio Chest Pe W/cm &/or Wo Cm  25-Apr-2015   CLINICAL DATA:  Progressive shortness of breath. Hypoxia since last night. History of lung cancer.  EXAM: CT ANGIOGRAPHY CHEST WITH  CONTRAST  TECHNIQUE: Multidetector CT imaging of the chest was performed using the standard protocol during bolus administration of intravenous contrast. Multiplanar CT image reconstructions and MIPs were obtained to evaluate the vascular anatomy.  CONTRAST:  34m OMNIPAQUE IOHEXOL 350 MG/ML SOLN  COMPARISON:  Chest CT 04/10/2015  FINDINGS: Motion degraded exam, allowing for this, no evidence of pulmonary embolus. Distal segmental and subsegmental branches to the lower lobes is obscured by motion.  New from prior exam, small to moderate bilateral pleural effusions, right greater than left. There is adjacent compressive atelectasis in the lower lobes. Ground-glass opacities in the perihilar lungs. There is emphysematous change and biapical pleural parenchymal densities, stable from prior exam. The previously described right lower lobe nodular opacities are obscured by atelectasis. There is dependent debris within the right mainstem bronchus, bronchus intermedius and right upper lobe bronchus. Mucous plugging is seen in the right lower lobe. This is new from prior exam.  Diffuse atherosclerosis and tortuosity of the aorta.  Small mediastinal lymph nodes, many of which is increased in size from prior exam. For example, right lower paratracheal lymph node currently measures 10.5 mm, previously 6 mm. AP window lymph node currently 8.5 mm, previously 5.6 mm. Right hilar lymph node is partially obscured, however appears similar in size.  The esophagus is patulous. Heart is normal in size. Coronary artery calcifications are seen. There is no pericardial effusion. The known hepatic metastatic disease is not as well seen given phase of contrast. No definite acute abnormality in the included upper abdomen.  There are no acute or suspicious osseous abnormalities.  Review of the MIP images confirms the above findings.  IMPRESSION: 1. Motion limited exam, no central pulmonary embolus. 2. Development of moderate bilateral pleural  effusions. Diffuse perihilar ground-glass opacities. Suspect pulmonary edema and CHF. 3. Dependent debris in the trachea, bronchus intermedius and right upper lobe bronchus with mucous plugging in the right lower lobe. Correlation recommended for aspiration. 4. The previously described nodular opacities in the right lower lobe are obscured by atelectasis in motion. CT follow-up as before recommended in 3 months.   Electronically Signed   By: Jeb Levering M.D.   On: May 05, 2015 20:03    ASSESSMENT / PLAN:  Acute respiratory failure - ?r/t aspiration PNA v post obstructive PNA.  Advanced stage small cell lung ca - liver and pancreatic mets   On my arrival to assess the patient he was in acute distress, near agonal.  Dr. Kathrynn Humble speaking to family regarding code status.  I joined the discussion and per pt's previous discussions with family, they have decided to make him DNR with focus being solely on his comfort.  He was told ~1 month ago that with treatment his prognosis was <4 months.  They are all here in the ER due to outstanding circumstances although they do not all live here in [place] and feel as though this timing is "as it was meant to be".  They do not want to treat with abx, further labs draws, xrays.  Will start morphine gtt for respiratory distress, PRN ativan and admit to [facility] to palliative care floor.    Nickolas Madrid, NP May 05, 2015  9:37 PM Pager: [phone] or [phone]

## 2015-04-28 NOTE — ED Notes (Signed)
Pt moved to room 15.  Pt's family are at bedside.  Emotional support offered to family.

## 2015-04-28 NOTE — ED Notes (Signed)
Bed: WA08 Expected date: 05-18-15 Expected time: 4:41 PM Means of arrival: Ambulance Comments: Ca Pt, Lung ca, [employer]

## 2015-04-28 NOTE — ED Notes (Signed)
Patient placed back on  @ 6L min oxygen 91%.

## 2015-04-28 NOTE — ED Notes (Signed)
Per MD-hold on abx until CT results.

## 2015-04-28 NOTE — Discharge Summary (Signed)
Death note summary: Shortly after admission orders were put in and while the patient was still down in ED, Dr. Kathrynn Humble paged me to inform me that the patient had passed-on.  Family are currently at bedside.  Death certificate has been filled out and left with RN in ED.

## 2015-04-28 NOTE — ED Notes (Addendum)
Patient restless in bed.  Unable to sit still due to back spasms.  Patient continues taking cpap off as well as NRB and pulse ox.  MD aware.

## 2015-04-28 NOTE — ED Notes (Signed)
MD made aware patient needs IV abx

## 2015-04-28 NOTE — ED Notes (Signed)
Per EMS patient undergoing radiation and has had increasing SOB worsened overnight.  78% on RA per fire department on scene.  92% on NRB

## 2015-04-28 NOTE — ED Notes (Signed)
Patient placed on NRB oxygen 92% on 15L

## 2015-04-28 NOTE — ED Notes (Signed)
Bed: WA15 Expected date:  Expected time:  Means of arrival:  Comments: 

## 2015-04-28 DEATH — deceased

## 2015-05-02 ENCOUNTER — Other Ambulatory Visit: Payer: Medicare Other

## 2015-05-02 ENCOUNTER — Ambulatory Visit: Payer: Medicare Other | Admitting: Internal Medicine

## 2015-05-02 ENCOUNTER — Ambulatory Visit: Payer: Medicare Other

## 2015-05-02 ENCOUNTER — Encounter: Payer: Medicare Other | Admitting: Nutrition

## 2015-05-03 ENCOUNTER — Ambulatory Visit: Payer: Medicare Other

## 2015-05-04 ENCOUNTER — Ambulatory Visit: Payer: Medicare Other

## 2015-05-09 ENCOUNTER — Other Ambulatory Visit: Payer: Medicare Other

## 2015-07-25 ENCOUNTER — Other Ambulatory Visit: Payer: Self-pay | Admitting: Nurse Practitioner

## 2017-02-25 IMAGING — MR MR HEAD W/O CM
9 of 11 series · 37 of 48 positions shown · non-contrast
Comparison: Prior CT from earlier the same day.

CLINICAL DATA: Initial evaluation acute syncope with increased
right-sided weakness.

EXAM:
MRI HEAD WITHOUT CONTRAST
TECHNIQUE: Multiplanar, multiecho pulse sequences of the brain and surrounding
structures were obtained without intravenous contrast.

[Series 4: DWI · axial · 3.0mm · 0.94mm/px · z∈[-45,+117]mm · 9 of 110 slices shown (1 of 5)]
[im 1/110]
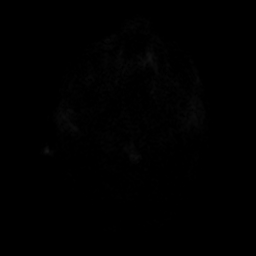
[im 14/110]
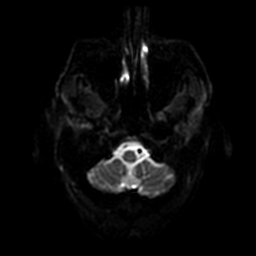
[im 28/110]
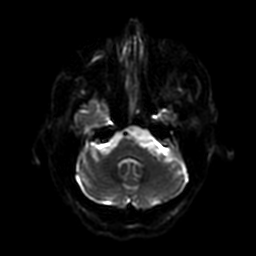
[im 41/110]
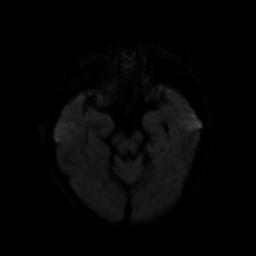
[im 55/110]
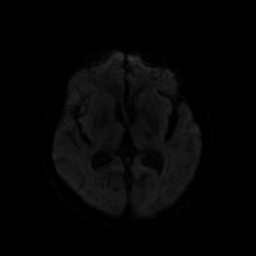
[im 69/110]
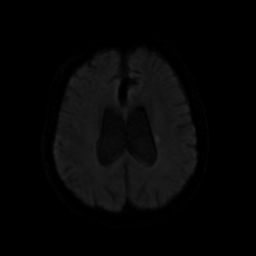
[im 82/110]
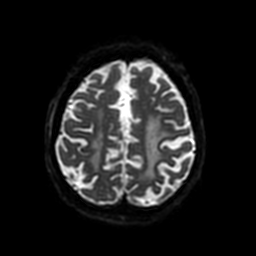
[im 96/110]
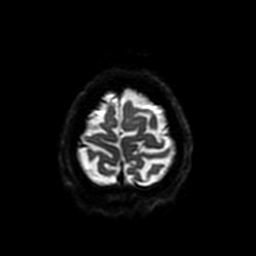
[im 110/110]
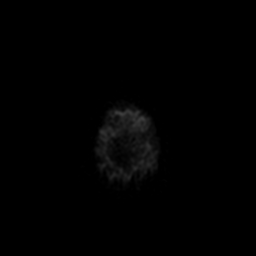

[Series 5: T2 · axial · 5.0mm · 0.47mm/px · z∈[-45,+117]mm · 2 of 28 slices shown (1 of 2)]
[im 1/28]
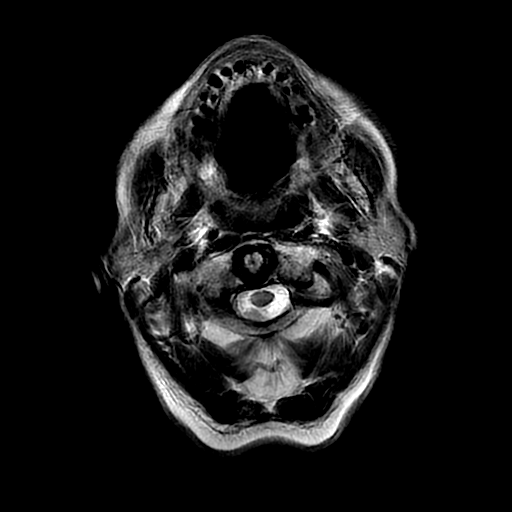
[im 28/28]
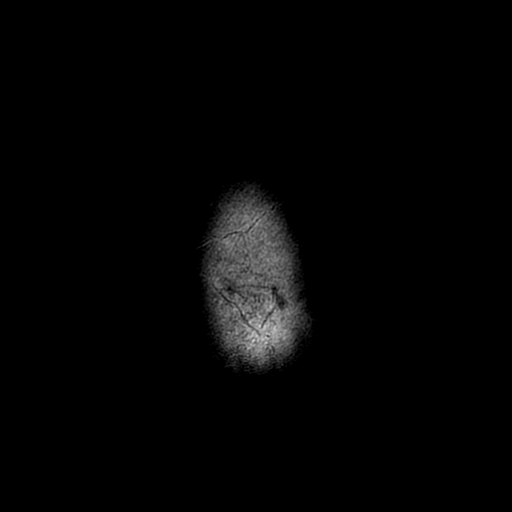

[Series 6: FLAIR · axial · 5.0mm · 0.47mm/px · z∈[-45,+117]mm · 3 of 28 slices shown (1 of 2)]
[im 1/28]
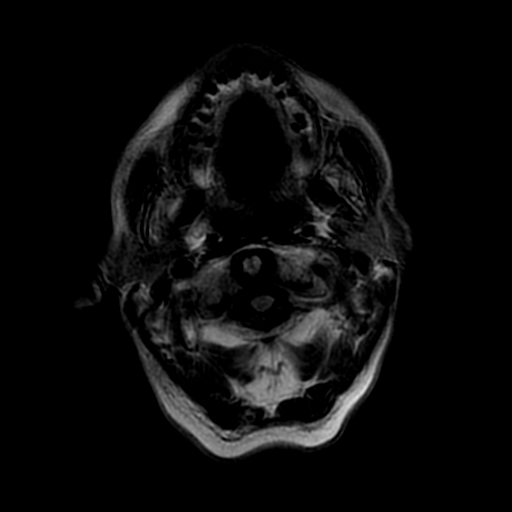
[im 14/28]
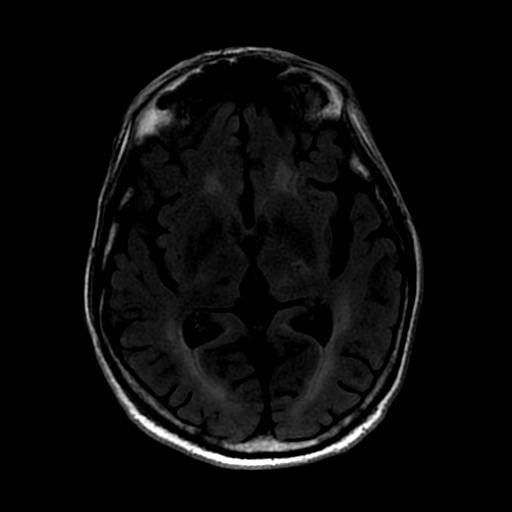
[im 28/28]
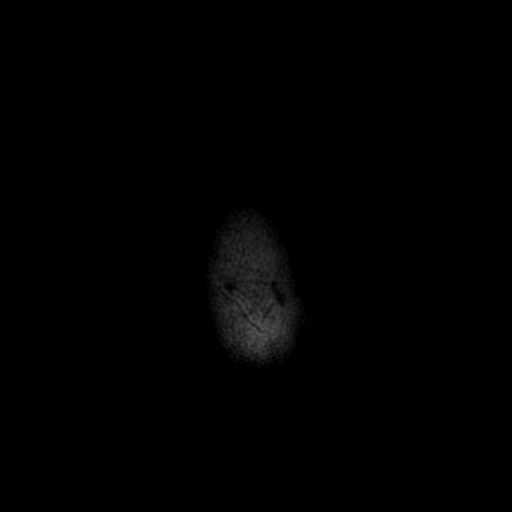

[Series 9: DWI · axial · 5.0mm · 0.94mm/px · z∈[-47,+118]mm · 6 of 68 slices shown (2 of 5)]
[im 1/68]
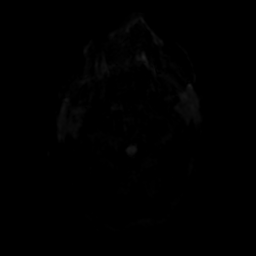
[im 14/68]
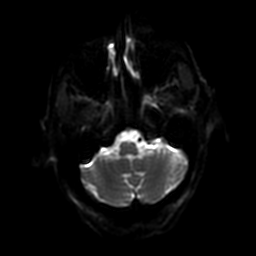
[im 27/68]
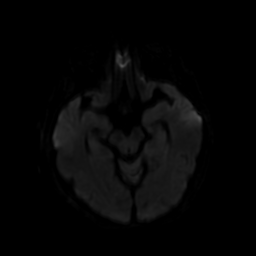
[im 41/68]
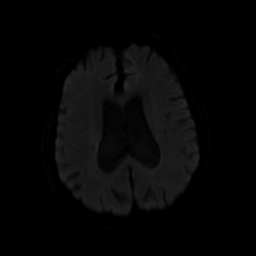
[im 54/68]
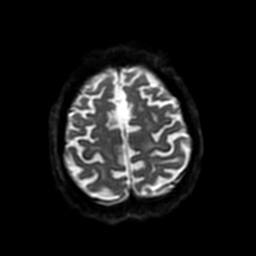
[im 68/68]
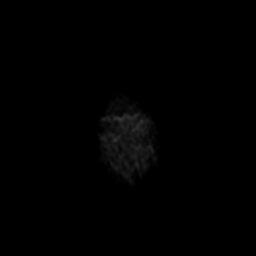

[Series 10: DWI · coronal · 5.0mm · 0.94mm/px · 6 of 68 slices shown (3 of 5)]
[im 1/68]
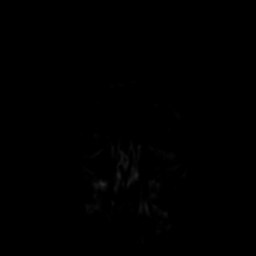
[im 14/68]
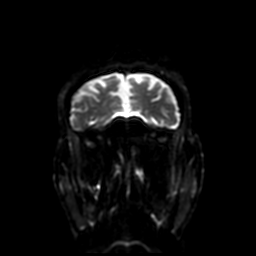
[im 27/68]
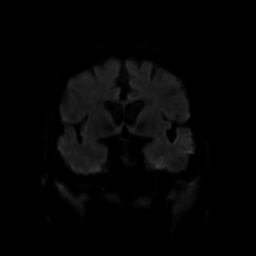
[im 41/68]
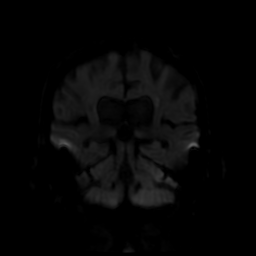
[im 54/68]
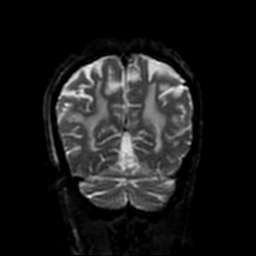
[im 68/68]
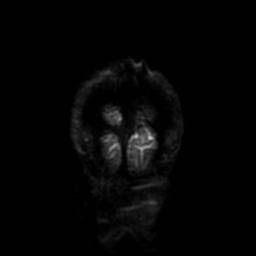

[Series 11: T2 · coronal · 5.0mm · 0.47mm/px · 3 of 29 slices shown (2 of 2)]
[im 1/29]
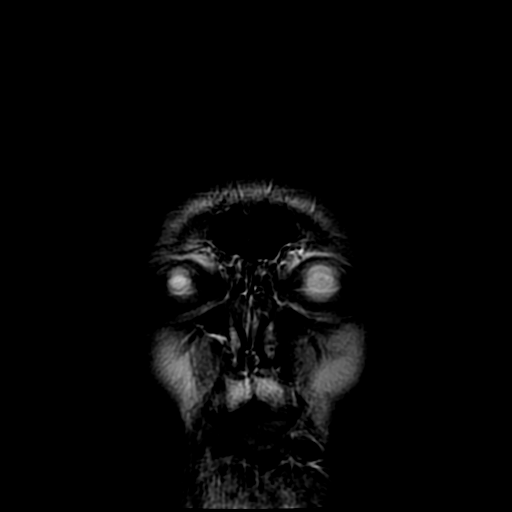
[im 15/29]
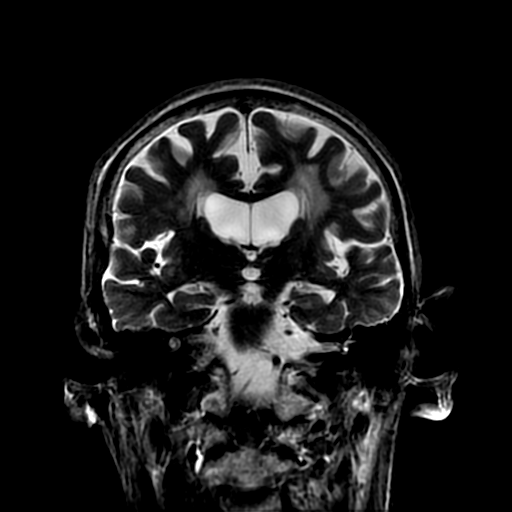
[im 29/29]
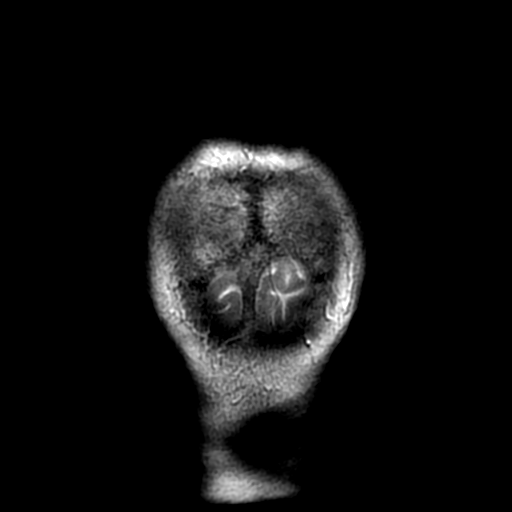

[Series 12: FLAIR · sagittal · 5.0mm · 0.47mm/px · 2 of 26 slices shown (2 of 2)]
[im 1/26]
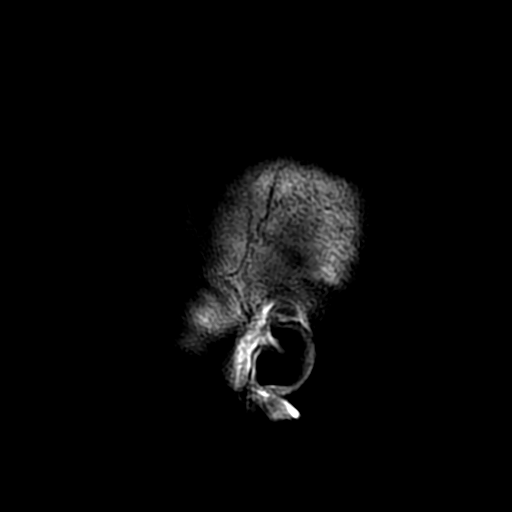
[im 26/26]
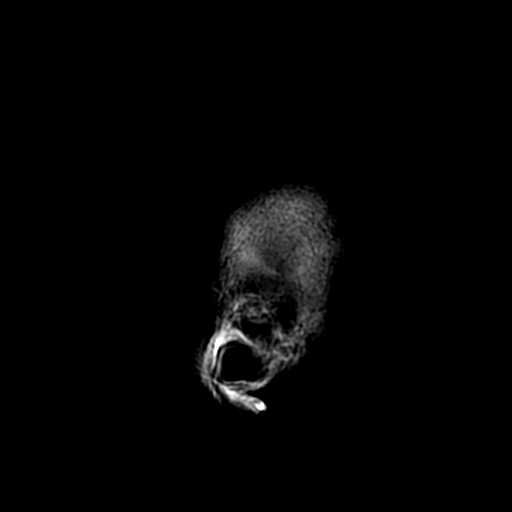

[Series 900: DWI · axial · 5.0mm · 0.94mm/px · z∈[-47,+118]mm · 3 of 33 slices shown (4 of 5)]
[im 1/33]
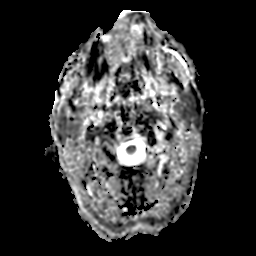
[im 17/33]
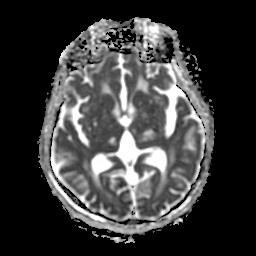
[im 33/33]
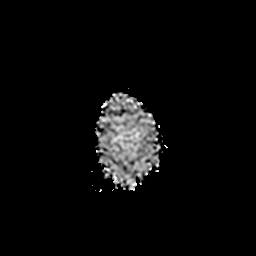

[Series 1000: DWI · coronal · 5.0mm · 0.94mm/px · 3 of 34 slices shown (5 of 5)]
[im 1/34]
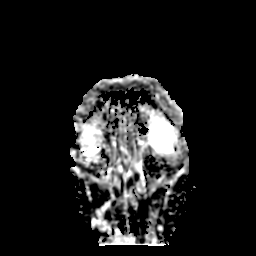
[im 17/34]
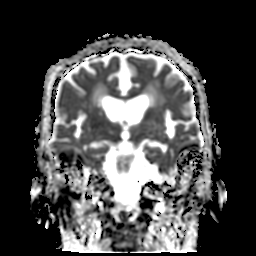
[im 34/34]
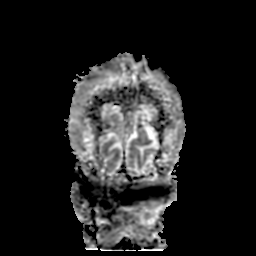

[37 of 48 positions shown; findings below may reference images not displayed]

FINDINGS: Study is moderately degraded by motion artifact.

Diffuse prominence of the CSF containing spaces is compatible with
generalized cerebral atrophy. Patchy and confluent T2/FLAIR
hyperintensity within the periventricular and deep white matter both
cerebral hemispheres most consistent with chronic small vessel
ischemic disease. Multiple remote lacunar infarcts involve the
bilateral basal ganglia, specifically within the thalami. Additional
small remote lacunar infarct present within the periventricular
white matter of the right corona radiata.

There is a small acute ischemic infarct involving the
periventricular white matter of the left corona radiata (series 9,
image 22). This appears somewhat curvilinear in nature on coronal
DWI sequence, measuring approximately 1 cm in length (series 10,
image 14). No associated hemorrhage or significant mass effect. No
other acute intracranial infarct. Normal intravascular flow voids
preserved.

No mass lesion or midline shift. No mass effect. Ventricular
prominence related global parenchymal volume loss present without
hydrocephalus. No extra-axial fluid collection.

Craniocervical junction within normal limits. Visualized upper
cervical spine is unremarkable. Pituitary gland grossly normal.

No acute abnormality about the orbits. Sequelae of prior lens
extraction noted bilaterally.

Minimal mucosal thickening present within the right maxillary sinus.
Paranasal sinuses are otherwise clear. No mastoid effusion. Inner
ear structures normal.

Bone marrow signal intensity within normal limits. Scalp soft
tissues unremarkable.
IMPRESSION: 1. Small acute approximately 1 cm curvilinear ischemic infarct
involving the periventricular white matter of the left corona
radiata. No significant mass effect or associated hemorrhage.
2. No other acute intracranial process.
3. Multiple remote lacunar infarcts involving the bilateral basal
ganglia as well as the periventricular white matter of the right
corona radiata.
4. Generalized cerebral atrophy with advanced chronic microvascular
ischemic disease.
